# Patient Record
Sex: Female | Born: 1990 | Hispanic: Yes | Marital: Married | Smoking: Former smoker
Health system: Southern US, Community
[De-identification: ages and names within clinical notes are randomized; demographics above are authoritative.]

## PROBLEM LIST (undated history)

## (undated) DIAGNOSIS — R519 Headache, unspecified: Secondary | ICD-10-CM

## (undated) DIAGNOSIS — J45909 Unspecified asthma, uncomplicated: Secondary | ICD-10-CM

## (undated) DIAGNOSIS — F32A Depression, unspecified: Secondary | ICD-10-CM

## (undated) DIAGNOSIS — F419 Anxiety disorder, unspecified: Secondary | ICD-10-CM

## (undated) DIAGNOSIS — F329 Major depressive disorder, single episode, unspecified: Secondary | ICD-10-CM

## (undated) DIAGNOSIS — O2686 Pruritic urticarial papules and plaques of pregnancy (PUPPP): Secondary | ICD-10-CM

## (undated) HISTORY — PX: HERNIA REPAIR: SHX51

---
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
## 2008-07-22 DIAGNOSIS — J45990 Exercise induced bronchospasm: Secondary | ICD-10-CM | POA: Insufficient documentation

## 2010-02-08 DIAGNOSIS — R41840 Attention and concentration deficit: Secondary | ICD-10-CM | POA: Insufficient documentation

## 2010-02-08 DIAGNOSIS — G43909 Migraine, unspecified, not intractable, without status migrainosus: Secondary | ICD-10-CM | POA: Insufficient documentation

## 2012-05-29 DIAGNOSIS — O2686 Pruritic urticarial papules and plaques of pregnancy (PUPPP): Secondary | ICD-10-CM

## 2013-12-26 DIAGNOSIS — F332 Major depressive disorder, recurrent severe without psychotic features: Secondary | ICD-10-CM | POA: Insufficient documentation

## 2013-12-26 DIAGNOSIS — M5430 Sciatica, unspecified side: Secondary | ICD-10-CM | POA: Insufficient documentation

## 2017-07-18 DIAGNOSIS — F4310 Post-traumatic stress disorder, unspecified: Secondary | ICD-10-CM | POA: Insufficient documentation

## 2019-01-15 HISTORY — PX: GALLBLADDER SURGERY: SHX652

## 2019-04-28 ENCOUNTER — Encounter (HOSPITAL_COMMUNITY): Payer: Self-pay | Admitting: *Deleted

## 2019-04-28 ENCOUNTER — Other Ambulatory Visit: Payer: Self-pay

## 2019-04-28 ENCOUNTER — Inpatient Hospital Stay (HOSPITAL_COMMUNITY): Payer: BC Managed Care – PPO

## 2019-04-28 ENCOUNTER — Inpatient Hospital Stay (HOSPITAL_COMMUNITY)
Admission: AD | Admit: 2019-04-28 | Discharge: 2019-04-28 | Disposition: A | Discharge status: Home / Self Care | Payer: BC Managed Care – PPO | Attending: Obstetrics and Gynecology | Admitting: Obstetrics and Gynecology

## 2019-04-28 DIAGNOSIS — O4691 Antepartum hemorrhage, unspecified, first trimester: Secondary | ICD-10-CM

## 2019-04-28 DIAGNOSIS — O200 Threatened abortion: Secondary | ICD-10-CM | POA: Diagnosis not present

## 2019-04-28 DIAGNOSIS — Z3491 Encounter for supervision of normal pregnancy, unspecified, first trimester: Secondary | ICD-10-CM | POA: Diagnosis not present

## 2019-04-28 DIAGNOSIS — O26891 Other specified pregnancy related conditions, first trimester: Secondary | ICD-10-CM | POA: Insufficient documentation

## 2019-04-28 DIAGNOSIS — Z87891 Personal history of nicotine dependence: Secondary | ICD-10-CM | POA: Insufficient documentation

## 2019-04-28 DIAGNOSIS — Z79899 Other long term (current) drug therapy: Secondary | ICD-10-CM | POA: Diagnosis not present

## 2019-04-28 DIAGNOSIS — O4690 Antepartum hemorrhage, unspecified, unspecified trimester: Secondary | ICD-10-CM

## 2019-04-28 DIAGNOSIS — Z3A01 Less than 8 weeks gestation of pregnancy: Secondary | ICD-10-CM | POA: Insufficient documentation

## 2019-04-28 DIAGNOSIS — R109 Unspecified abdominal pain: Secondary | ICD-10-CM | POA: Diagnosis not present

## 2019-04-28 HISTORY — DX: Anxiety disorder, unspecified: F41.9

## 2019-04-28 HISTORY — DX: Depression, unspecified: F32.A

## 2019-04-28 HISTORY — DX: Unspecified asthma, uncomplicated: J45.909

## 2019-04-28 HISTORY — DX: Headache, unspecified: R51.9

## 2019-04-28 LAB — CBC
HCT: 40.7 % (ref 36.0–46.0)
Hemoglobin: 13.4 g/dL (ref 12.0–15.0)
MCH: 28.6 pg (ref 26.0–34.0)
MCHC: 32.9 g/dL (ref 30.0–36.0)
MCV: 86.8 fL (ref 80.0–100.0)
Platelets: 285 10*3/uL (ref 150–400)
RBC: 4.69 MIL/uL (ref 3.87–5.11)
RDW: 13.5 % (ref 11.5–15.5)
WBC: 8.6 10*3/uL (ref 4.0–10.5)
nRBC: 0 % (ref 0.0–0.2)

## 2019-04-28 LAB — WET PREP, GENITAL
Clue Cells Wet Prep HPF POC: NONE SEEN
Sperm: NONE SEEN
Trich, Wet Prep: NONE SEEN
Yeast Wet Prep HPF POC: NONE SEEN

## 2019-04-28 LAB — ABO/RH: ABO/RH(D): O POS

## 2019-04-28 LAB — URINALYSIS, ROUTINE W REFLEX MICROSCOPIC
Bilirubin Urine: NEGATIVE
Glucose, UA: NEGATIVE mg/dL
Ketones, ur: NEGATIVE mg/dL
Leukocytes,Ua: NEGATIVE
Nitrite: NEGATIVE
Protein, ur: NEGATIVE mg/dL
RBC / HPF: >50 RBC/hpf — ABNORMAL HIGH (ref 0–5)
Specific Gravity, Urine: 1.023 (ref 1.005–1.030)
pH: 5 (ref 5.0–8.0)

## 2019-04-28 LAB — HCG, QUANTITATIVE, PREGNANCY: hCG, Beta Chain, Quant, S: 6112 m[IU]/mL — ABNORMAL HIGH (ref <5)

## 2019-04-28 LAB — POCT PREGNANCY, URINE: Preg Test, Ur: POSITIVE — AB

## 2019-04-28 NOTE — MAU Note (Signed)
+  HPT. LMP 03/21/19.   Vaginal bleeding since Friday, red blood on pad, but not soaking pads. Cramping started Saturday, improved now 3/10 on scale.

## 2019-04-28 NOTE — MAU Provider Note (Signed)
History     CSN: [id number]  Arrival date and time: 04/28/19 1148   First Provider Initiated Contact with Patient 04/28/19 1237      Chief Complaint  Patient presents with  . Abdominal Pain  . Vaginal Bleeding   Vicki Ford is a 28 y.o. G2P2 at [redacted]w[redacted]d by LMP who presents to MAU with complaints of vaginal bleeding and abdominal pain. Patient reports she has spotting for one day when she first found out she was pregnant, then had a couple of days of spotting 2 weeks ago that resolved. Reports on Friday she started back spotting and since then it has gotten heavier like of period. Describes bleeding as dark red with tiny clots- patient reports clots smaller than dime size. She denies hx of miscarriage in the past. She reports being seen at [facility] on Saturday and was told that nothing was seen on [place]. She reports that she feels like they did not do anything and did not tell her what is actually going on. She reports abdominal pain started occurring on Saturday, describes abdominal pain as lower abdominal cramping, rates pain 3/10- has not taken any medication for abdominal pain.    OB History    Gravida  2   Para  1   Term  0   Preterm  1   AB  0   Living  2     SAB  0   TAB  0   Ectopic  0   Multiple  1   Live Births  2           Past Medical History:  Diagnosis Date  . Anxiety   . Asthma    sports induced asthma  . Depression   . Headache   . Preterm labor     Past Surgical History:  Procedure Laterality Date  . CESAREAN SECTION    . GALLBLADDER SURGERY  01/15/2019  . HERNIA REPAIR      Family History  Problem Relation Age of Onset  . Cancer Mother   . Depression Mother   . ADD / ADHD Sister   . Depression Sister     Social History   Tobacco Use  . Smoking status: Former Games developer  . Smokeless tobacco: Never Used  Substance Use Topics  . Alcohol use: Not Currently  . Drug use: Never    Allergies: Not on File  Medications Prior to Admission   Medication Sig Dispense Refill Last Dose  . hydrOXYzine (ATARAX/VISTARIL) 25 MG tablet Take 25 mg by mouth 3 (three) times daily as needed.   Past Month at Unknown time  . sertraline (ZOLOFT) 50 MG tablet Take 50 mg by mouth daily.   Past Month at Unknown time    Review of Systems  Constitutional: Negative.   Respiratory: Negative.   Cardiovascular: Negative.   Gastrointestinal: Positive for abdominal pain. Negative for constipation, diarrhea, nausea and vomiting.  Genitourinary: Positive for vaginal bleeding. Negative for difficulty urinating, dysuria, frequency, pelvic pain and urgency.  Musculoskeletal: Negative.   Neurological: Negative.    Physical Exam   Blood pressure 124/70, pulse 100, temperature 98.7 F (37.1 C), resp. rate 18, weight 89.8 kg, last menstrual period 03/21/2019, SpO2 100 %.  Physical Exam  Nursing note and vitals reviewed. Constitutional: She is oriented to person, place, and time. She appears well-developed and well-nourished.  Cardiovascular: Normal rate and regular rhythm.  Respiratory: Effort normal and breath sounds normal. No respiratory distress. She has no wheezes.  GI:  Soft. She exhibits no distension. There is no abdominal tenderness. There is no rebound and no guarding.  Genitourinary:    Vaginal bleeding present.  There is bleeding in the vagina.    Genitourinary Comments: Pelvic exam: Cervix pink, visually open to 1cm- blood clot in cervical os, without lesion, small amount of dark red vaginal bleeding, vaginal walls and external genitalia normal Bimanual exam: Cervix closed internally/long/high, firm, anterior, neg CMT, uterus nontender, nonenlarged, adnexa without tenderness, enlargement, or mass   Neurological: She is alert and oriented to person, place, and time.  Psychiatric: She has a normal mood and affect. Her behavior is normal. Thought content normal.   Dilation: Closed(external os slightly open with clot in cervical os) Cervical  Position: Posterior Exam by:: Steward DroneVeronica Ariann Khaimov, CNM  MAU Course  Procedures  MDM  Chart reviewed- no information or records in care everywhere  Labs and US ordered d/t no records   Orders placed:  Orders Placed This Encounter  Procedures  . Wet prep, genital  . US OB LESS THAN 14 WEEKS WITH OB TRANSVAGINAL  . Urinalysis, Routine w reflex microscopic  . CBC  . hCG, quantitative, pregnancy  . Pregnancy, urine POC  . ABO/Rh   Labs and US reviewed:  Results for orders placed or performed during the hospital encounter of 04/28/19 (from the past 24 hour(s))  Pregnancy, urine POC     Status: Abnormal   Collection Time: 04/28/19 12:09 PM  Result Value Ref Range   Preg Test, Ur POSITIVE (A) NEGATIVE  Urinalysis, Routine w reflex microscopic     Status: Abnormal   Collection Time: 04/28/19 12:15 PM  Result Value Ref Range   Color, Urine YELLOW YELLOW   APPearance CLEAR CLEAR   Specific Gravity, Urine 1.023 1.005 - 1.030   pH 5.0 5.0 - 8.0   Glucose, UA NEGATIVE NEGATIVE mg/dL   Hgb urine dipstick LARGE (A) NEGATIVE   Bilirubin Urine NEGATIVE NEGATIVE   Ketones, ur NEGATIVE NEGATIVE mg/dL   Protein, ur NEGATIVE NEGATIVE mg/dL   Nitrite NEGATIVE NEGATIVE   Leukocytes,Ua NEGATIVE NEGATIVE   RBC / HPF >50 (H) 0 - 5 RBC/hpf   WBC, UA 0-5 0 - 5 WBC/hpf   Bacteria, UA RARE (A) NONE SEEN   Squamous Epithelial / LPF 0-5 0 - 5   Mucus PRESENT   Wet prep, genital     Status: Abnormal   Collection Time: 04/28/19 12:28 PM   Specimen: PATH Cytology Cervicovaginal Ancillary Only  Result Value Ref Range   Yeast Wet Prep HPF POC NONE SEEN NONE SEEN   Trich, Wet Prep NONE SEEN NONE SEEN   Clue Cells Wet Prep HPF POC NONE SEEN NONE SEEN   WBC, Wet Prep HPF POC MODERATE (A) NONE SEEN   Sperm NONE SEEN   CBC     Status: None   Collection Time: 04/28/19 12:37 PM  Result Value Ref Range   WBC 8.6 4.0 - 10.5 K/uL   RBC 4.69 3.87 - 5.11 MIL/uL   Hemoglobin 13.4 12.0 - 15.0 g/dL   HCT 16.140.7  09.636.0 - 04.546.0 %   MCV 86.8 80.0 - 100.0 fL   MCH 28.6 26.0 - 34.0 pg   MCHC 32.9 30.0 - 36.0 g/dL   RDW 40.913.5 81.111.5 - 91.415.5 %   Platelets 285 150 - 400 K/uL   nRBC 0.0 0.0 - 0.2 %  ABO/Rh     Status: None   Collection Time: 04/28/19 12:37 PM  Result Value Ref  Range   ABO/RH(D) O POS    No rh immune globuloin      NOT A RH IMMUNE GLOBULIN CANDIDATE, PT RH POSITIVE Performed at [facility] [address], [place], [place] [address]   hCG, quantitative, pregnancy     Status: Abnormal   Collection Time: 04/28/19 12:37 PM  Result Value Ref Range   hCG, Beta Chain, Quant, S 6,112 (H) <5 mIU/mL   US Ob Less Than 14 Weeks With Ob Transvaginal  Result Date: 04/28/2019 CLINICAL DATA:  Pelvic pain with vaginal bleeding EXAM: OBSTETRIC <14 WK [place] AND TRANSVAGINAL OB US TECHNIQUE: Both transabdominal and transvaginal ultrasound examinations were performed for complete evaluation of the gestation as well as the maternal uterus, adnexal regions, and pelvic cul-de-sac. Transvaginal technique was performed to assess early pregnancy. COMPARISON:  None. FINDINGS: Intrauterine gestational sac: Visualized Yolk sac:  Visualized Embryo:  Not visualized Cardiac Activity: Not visualized MSD: 6 mm mm   5 w   2 d Subchorionic hemorrhage:  None visualized. Maternal uterus/adnexae: Cervical os is closed. Right ovary measures 2.4 x 1.6 x 1.7 cm. Left ovary measures 3.2 x 2.3 x 2.5 cm. No extrauterine pelvic or adnexal mass. No free pelvic fluid. IMPRESSION: There is a small gestational sac within the uterus. There is an apparent yolk sac but no fetal pole seen currently. This finding warrants a follow-up ultrasound in 10-12 weeks to assess for fetal pole and fetal heart activity. No subchorionic hemorrhage evident. Study otherwise unremarkable. Electronically Signed   By: Lowella Grip III M.D.   On: 04/28/2019 13:49   Discussed results of [place] and labs with patient- threatened miscarriage precautions reviewed with  patient. Follow up HCG scheduled in the office for Wednesday and repeat US ordered to assess viability. Patient understands plan of care.   Discussed reasons to return to MAU. Follow up as scheduled in the office. Return to MAU as needed. Pt stable at time of discharge.   Assessment and Plan   1. Normal IUP (intrauterine pregnancy) on prenatal ultrasound, first trimester   2. Abdominal pain during pregnancy in first trimester   3. Vaginal bleeding during pregnancy   4. Threatened miscarriage in early pregnancy    Discharge home Follow up as scheduled in the office for repeat HCG on Wednesday  Return to MAU as needed for reasons discussed and/or emergencies  Viability [place] ordered   [facility]. Go on 04/30/2019.   Specialty: Obstetrics and Gynecology Why: Go to office on Wednesday morning for repeat labs  Contact information: [address] [place], [place] [id number] [place] [id number] [phone]         Allergies as of 04/28/2019   Not on File     Medication List    TAKE these medications   hydrOXYzine 25 MG tablet Commonly known as: ATARAX/VISTARIL Take 25 mg by mouth 3 (three) times daily as needed.   sertraline 50 MG tablet Commonly known as: ZOLOFT Take 50 mg by mouth daily.       Lajean Manes CNM 04/28/2019, 3:05 PM

## 2019-04-29 LAB — CULTURE, OB URINE: Culture: NO GROWTH

## 2019-04-29 LAB — GC/CHLAMYDIA PROBE AMP (~~LOC~~) NOT AT ARMC
Chlamydia: NEGATIVE
Comment: NEGATIVE
Comment: NORMAL
Neisseria Gonorrhea: NEGATIVE

## 2019-04-30 ENCOUNTER — Encounter (HOSPITAL_COMMUNITY): Payer: Self-pay

## 2019-04-30 ENCOUNTER — Other Ambulatory Visit: Payer: Self-pay

## 2019-04-30 ENCOUNTER — Inpatient Hospital Stay (HOSPITAL_COMMUNITY)
Admission: AD | Admit: 2019-04-30 | Discharge: 2019-04-30 | Disposition: A | Discharge status: Home / Self Care | Payer: BC Managed Care – PPO | Attending: Obstetrics & Gynecology | Admitting: Obstetrics & Gynecology

## 2019-04-30 ENCOUNTER — Ambulatory Visit (INDEPENDENT_AMBULATORY_CARE_PROVIDER_SITE_OTHER): Payer: BC Managed Care – PPO

## 2019-04-30 ENCOUNTER — Inpatient Hospital Stay (HOSPITAL_COMMUNITY): Payer: BC Managed Care – PPO

## 2019-04-30 DIAGNOSIS — Z3A01 Less than 8 weeks gestation of pregnancy: Secondary | ICD-10-CM

## 2019-04-30 DIAGNOSIS — O209 Hemorrhage in early pregnancy, unspecified: Secondary | ICD-10-CM | POA: Diagnosis present

## 2019-04-30 DIAGNOSIS — Z3A00 Weeks of gestation of pregnancy not specified: Secondary | ICD-10-CM | POA: Insufficient documentation

## 2019-04-30 DIAGNOSIS — O200 Threatened abortion: Secondary | ICD-10-CM

## 2019-04-30 LAB — CBC
HCT: 39.7 % (ref 36.0–46.0)
Hemoglobin: 13.1 g/dL (ref 12.0–15.0)
MCH: 28.6 pg (ref 26.0–34.0)
MCHC: 33 g/dL (ref 30.0–36.0)
MCV: 86.7 fL (ref 80.0–100.0)
Platelets: 287 10*3/uL (ref 150–400)
RBC: 4.58 MIL/uL (ref 3.87–5.11)
RDW: 13.5 % (ref 11.5–15.5)
WBC: 8.9 10*3/uL (ref 4.0–10.5)
nRBC: 0 % (ref 0.0–0.2)

## 2019-04-30 LAB — BETA HCG QUANT (REF LAB): hCG Quant: 5693 m[IU]/mL

## 2019-04-30 NOTE — Progress Notes (Signed)
Pt states bleeding had subsided to a brown discharge, however, today after clot passed she has had bright red bleeding.  Has not seen any clots since then.

## 2019-04-30 NOTE — MAU Provider Note (Signed)
History     CSN: [id number]  Arrival date and time: 04/30/19 1422   None     Chief Complaint  Patient presents with  . Vaginal Bleeding   HPI   Vicki Ford is a 28 y.o., [id number] @ [redacted]w[redacted]d by LMP here for vaginal bleeding and lower abdominal cramping since 11/27. Patient was initially seen at [facility] on 11/28 and again here at MAU on 11/30 for the same symptoms and they have not stopped since. Vaginal bleeding has been low in volume (patient is using a panty liner/day) and varies from bright red blood to dark brown. Clots have been seen since 11/30 and have been increasing in size since. Today she passed one clot that was about size of a golf ball. The abdominal cramping is intermittent and not painful.  Blood type O positive.  Patient denies : lightheadedness, fatigue, chest pain, urinary symptoms, fever, back pain, nausea, vomiting   Patient's hCG quantitative was 3,213 on 11/28 and 6,112 on 11/30. Patient had another drawn as outpatient today, result pending.    OB History    Gravida  2   Para  1   Term  0   Preterm  1   AB  0   Living  2     SAB  0   TAB  0   Ectopic  0   Multiple  1   Live Births  2           Past Medical History:  Diagnosis Date  . Anxiety   . Asthma    sports induced asthma  . Depression   . Headache   . Preterm labor     Past Surgical History:  Procedure Laterality Date  . CESAREAN SECTION    . GALLBLADDER SURGERY  01/15/2019  . HERNIA REPAIR      Family History  Problem Relation Age of Onset  . Cancer Mother   . Depression Mother   . ADD / ADHD Sister   . Depression Sister     Social History   Tobacco Use  . Smoking status: Former Research scientist (life sciences)  . Smokeless tobacco: Never Used  Substance Use Topics  . Alcohol use: Not Currently  . Drug use: Never    Allergies: Not on File  Medications Prior to Admission  Medication Sig Dispense Refill Last Dose  . hydrOXYzine (ATARAX/VISTARIL) 25 MG tablet Take 25 mg by mouth 3  (three) times daily as needed.     . sertraline (ZOLOFT) 50 MG tablet Take 50 mg by mouth daily.       Review of Systems  Constitutional: Negative.   Cardiovascular: Negative.   Gastrointestinal: Positive for abdominal pain (intermittent mild lower abdominal cramping, non painful per patient). Negative for diarrhea, nausea and vomiting.  Genitourinary: Positive for vaginal bleeding. Negative for dysuria, flank pain, frequency, pelvic pain, vaginal discharge and vaginal pain.  Musculoskeletal: Negative for back pain.   Physical Exam   Blood pressure 115/82, pulse 92, temperature 98.5 F (36.9 C), temperature source Oral, resp. rate 18, weight 89.6 kg, last menstrual period 03/21/2019, SpO2 100 %.  Physical Exam  Nursing note and vitals reviewed. Constitutional: She is oriented to person, place, and time. She appears well-developed and well-nourished. No distress.  Cardiovascular: Normal rate.  Respiratory: Effort normal.  GI: Soft.  Genitourinary:    Vagina normal.  There is no rash, tenderness, lesion or injury on the right labia. There is no rash, tenderness, lesion or injury on the left labia.  Cervix exhibits discharge. Cervix exhibits no friability.    Genitourinary Comments: One fox swab use to clean small clots x 2 at cervical os. Normal looking cervical os with minimal bleeding. Minimal blood visualized at vaginal vault.    Neurological: She is alert and oriented to person, place, and time.  Skin: Skin is warm and dry. No erythema.  Psychiatric: She has a normal mood and affect.    MAU Course  Procedures  - Patient's hCG values have been : 3,213 (11/28), 6,112 (11/30). CBC, wet prep, CB/CT, and UA all WNL on 11/30. Will not get hCG today. Will get CBC and transvaginal US today.  Results for orders placed or performed during the hospital encounter of 04/30/19 (from the past 24 hour(s))  CBC     Status: None   Collection Time: 04/30/19  4:26 PM  Result Value Ref Range   WBC  8.9 4.0 - 10.5 K/uL   RBC 4.58 3.87 - 5.11 MIL/uL   Hemoglobin 13.1 12.0 - 15.0 g/dL   HCT 16.139.7 09.636.0 - 04.546.0 %   MCV 86.7 80.0 - 100.0 fL   MCH 28.6 26.0 - 34.0 pg   MCHC 33.0 30.0 - 36.0 g/dL   RDW 40.913.5 81.111.5 - 91.415.5 %   Platelets 287 150 - 400 K/uL   nRBC 0.0 0.0 - 0.2 %   [place] Ob Transvaginal  Result Date: 04/30/2019 CLINICAL DATA:  Bleeding EXAM: TRANSVAGINAL OB ULTRASOUND TECHNIQUE: Transvaginal ultrasound was performed for complete evaluation of the gestation as well as the maternal uterus, adnexal regions, and pelvic cul-de-sac. COMPARISON:  04/28/2019 FINDINGS: Intrauterine gestational sac: Sac-like structure in the lower uterine segment without significant change in size. Yolk sac:  Not seen Embryo:  Not seen MSD: 4.4 mm   5 w   1 d Subchorionic hemorrhage:  None visualized. Maternal uterus/adnexae: Ovaries are within normal limits. Right ovary measures 2.5 x 1.6 x 1.7 cm. Left ovary measures 3 x 2 x 2.6 cm. IMPRESSION: 1. Small sac-like structure in the lower uterine segment without significant change in position. Previously suggested yolk sac is no longer identified. No embryo identified. Electronically Signed   By: Jasmine PangKim  Fujinaga M.D.   On: 04/30/2019 17:52    MDM - Patient is mainly concerned about the clots and states that her bleeding has been minimal. Discussed with the patient regarding reassuring recent hCG values but will need US to r/o other possible pregnancy complications. Last US from 11/30 showed a small gestation sac and an apparent yolk sac in the uterus w/o fetal pole visualized and no evident subchorionic hemorrhage. - hCG from outpatient today is 5,693 (6,122 two days ago). No significant changes in US findings compared to two days ago.  - Discussed with patient that a threatened miscarriage is suspected and cannot be r/o in or out at this time. Will order a f/u US 8 days from today which would be 10 days from the last US from two days ago.       Assessment and Plan    Problem List Items Addressed This Visit    None    Visit Diagnoses    Threatened miscarriage in early pregnancy    -  Primary   Relevant Orders   Discharge patient   US OB Transvaginal   Vaginal bleeding in pregnancy, first trimester       Relevant Orders   US OB Transvaginal (Completed)   Discharge patient   US OB Transvaginal     1. Threatened  miscarriage in early pregnancy - F/U US ordered for 05/08/19.  2. Vaginal bleeding in pregnancy, first trimester - Discussed with the patient that the bleeding may continue for a while. Educated the patient to come back to MAU if her symptoms worsen.   Eduard Clos 04/30/2019, 7:14 PM

## 2019-04-30 NOTE — MAU Note (Signed)
Was seen for bleeding the other day, told to return if anything changes. Passed a large clot about 35 min ago(larger then a quarter, smaller then a golfball). No pain, bleeding has been like a period.

## 2019-05-01 NOTE — Progress Notes (Signed)
Pt here today for STAT Beta Lab.  Pt states that she passed a large clot within the past 30 minutes of arriving for her beta today.  Pt states that she thinks that she is having a miscarriage.  I explained to the pt that we get her pregnancy hormone level to compare results.  Pt informed that I would call within the next 2-3 hours with results.     Received results of beta levels were 5693.  Per chart review pt went to MAU after leaving from the office.    Called pt on 05/01/19 because pt went to MAU.  Pt reports that she went to MAU yesterday because after she left the office she started bleeding like a period.  Pt stated that she was advised that MAU would be scheduling her an [place] for evaluation.  I notified pt her beta results and continue with the plan of care from MAU.  Pt verbalized understanding.   Mel Almond, RN 05/01/19

## 2019-05-12 ENCOUNTER — Encounter: Payer: Self-pay | Admitting: *Deleted

## 2019-05-12 ENCOUNTER — Other Ambulatory Visit: Payer: Self-pay

## 2019-05-12 ENCOUNTER — Ambulatory Visit (HOSPITAL_COMMUNITY)
Admission: RE | Admit: 2019-05-12 | Discharge: 2019-05-12 | Disposition: A | Discharge status: Home / Self Care | Payer: BC Managed Care – PPO | Source: Ambulatory Visit | Attending: Advanced Practice Midwife | Admitting: Advanced Practice Midwife

## 2019-05-12 ENCOUNTER — Ambulatory Visit (INDEPENDENT_AMBULATORY_CARE_PROVIDER_SITE_OTHER): Payer: BC Managed Care – PPO | Admitting: Family Medicine

## 2019-05-12 VITALS — Wt 196.0 lb

## 2019-05-12 DIAGNOSIS — O209 Hemorrhage in early pregnancy, unspecified: Secondary | ICD-10-CM | POA: Diagnosis present

## 2019-05-12 DIAGNOSIS — Z3490 Encounter for supervision of normal pregnancy, unspecified, unspecified trimester: Secondary | ICD-10-CM

## 2019-05-12 DIAGNOSIS — O200 Threatened abortion: Secondary | ICD-10-CM | POA: Insufficient documentation

## 2019-05-12 NOTE — Progress Notes (Signed)
Pt here for u/s results. Has some brown d/c and occ crampiness but none currently. Spoke with Dr Marice Potter who reviewed u/s results. Will repeat BHCG today (nonstat) . Pt is very frustrated that a sac was seen several wks ago and now no sac seen. [place] to see twins at 4 wks and she would like to speak to MD. Dr Marice Potter in to talk with pt

## 2019-05-12 NOTE — Progress Notes (Signed)
GYNECOLOGY OFFICE VISIT NOTE  History:   Vicki Ford is a 28 y.o. [phone] here today for [place] f/u and HCG. Patient reports LMP 10/23 with positive pregnancy test the next week. Reports some spotting/bleeding at the end of November; does report passing some clots. Described as lighter than a menstrual period but heavier than spotting. She had an [place] 12/2 which showed small sac-like structure in lower uterine segment, no yolk sac present. HCG 5693 at that time; previously 4540 on 11/30. Patient reports breast tenderness and overall feeling like she is pregnant. She is very frustrated that she still does not have an answer on exactly what is going on. Light spotting currently.    Past Medical History:  Diagnosis Date  . Anxiety    takes zoloft, buspar, & atarax  . Asthma    sports induced asthma  . Depression    takes zoloft & buspar  . Headache   . Preterm labor     Past Surgical History:  Procedure Laterality Date  . CESAREAN SECTION    . GALLBLADDER SURGERY  01/15/2019  . HERNIA REPAIR      The following portions of the patient's history were reviewed and updated as appropriate: allergies, current medications, past family history, past medical history, past social history, past surgical history and problem list.    Review of Systems:  Pertinent items noted in HPI and remainder of comprehensive ROS otherwise negative.  Physical Exam:  Wt 196 lb (88.9 kg)   LMP 03/21/2019 (Exact Date)  CONSTITUTIONAL: Well-developed, well-nourished female in no acute distress.  HEENT:  Normocephalic, atraumatic. External right and left ear normal. No scleral icterus.  NECK: Normal range of motion, supple, no masses noted on observation SKIN: No rash noted. Not diaphoretic. No erythema. No pallor. MUSCULOSKELETAL: Normal range of motion. No edema noted. NEUROLOGIC: Alert and oriented to person, place, and time. Normal muscle tone coordination. No cranial nerve deficit noted. PSYCHIATRIC: Normal  mood and affect. Normal behavior. Normal judgment and thought content. CARDIOVASCULAR: Normal heart rate noted RESPIRATORY: Effort normal  ABDOMEN: No masses noted. No other overt distention noted.   PELVIC: Deferred  Labs and Imaging No results found for this or any previous visit (from the past 168 hour(s)). US OB Transvaginal  Addendum Date: 05/12/2019   ADDENDUM REPORT: 05/12/2019 14:54 COMPARISON:  04/30/2019 and 04/28/2019. FINDINGS: The previously seen sac-like structure in the lower uterine segment is no longer visualized on today's study. In its place is an echogenic area measuring 16 x 10 mm. Given that this echogenic area was not seen on prior study, a polyp is felt unlikely. This could reflect blood products. IMPRESSION: Previously seen sac-like structure in the lower uterine segment no longer visualized. Probable blood clot within the endocervical canal. Echogenic area in the lower uterine segment may also reflect blood products. Electronically Signed   By: Rolm Baptise M.D.   On: 05/12/2019 14:54   Result Date: 05/12/2019 CLINICAL DATA:  Threatened abortion EXAM: TRANSVAGINAL OB ULTRASOUND TECHNIQUE: Transvaginal ultrasound was performed for complete evaluation of the gestation as well as the maternal uterus, adnexal regions, and pelvic cul-de-sac. COMPARISON:  None. FINDINGS: Intrauterine gestational sac: None Yolk sac:  Not visualized Embryo:  Not visualized Cardiac Activity: Not visualized Heart Rate:  bpm MSD:   mm    w     d CRL:     mm    w  d  US EDC: Subchorionic hemorrhage:  N/A Maternal uterus/adnexae: There is clot seen within the endocervical canal. Echogenic area is noted with in the endometrium in the lower uterine segment measuring 16 x 10 mm. No adnexal mass or free fluid. IMPRESSION: No intrauterine pregnancy visualized. Differential considerations would include early intrauterine pregnancy too early to visualize, spontaneous abortion, or occult ectopic  pregnancy. Recommend close clinical followup and serial quantitative beta HCGs and ultrasounds. 16 x 10 mm echogenic area within the endometrium in the lower uterine segment. This could reflect endometrial polyp. Blood products suspected in the endocervical canal. Electronically Signed: By: Charlett NoseKevin  Dover M.D. On: 05/12/2019 14:35   US OB Transvaginal  Result Date: 04/30/2019 CLINICAL DATA:  Bleeding EXAM: TRANSVAGINAL OB ULTRASOUND TECHNIQUE: Transvaginal ultrasound was performed for complete evaluation of the gestation as well as the maternal uterus, adnexal regions, and pelvic cul-de-sac. COMPARISON:  04/28/2019 FINDINGS: Intrauterine gestational sac: Sac-like structure in the lower uterine segment without significant change in size. Yolk sac:  Not seen Embryo:  Not seen MSD: 4.4 mm   5 w   1 d Subchorionic hemorrhage:  None visualized. Maternal uterus/adnexae: Ovaries are within normal limits. Right ovary measures 2.5 x 1.6 x 1.7 cm. Left ovary measures 3 x 2 x 2.6 cm. IMPRESSION: 1. Small sac-like structure in the lower uterine segment without significant change in position. Previously suggested yolk sac is no longer identified. No embryo identified. Electronically Signed   By: Jasmine PangKim  Fujinaga M.D.   On: 04/30/2019 17:52   US OB LESS THAN 14 WEEKS WITH OB TRANSVAGINAL  Result Date: 04/28/2019 CLINICAL DATA:  Pelvic pain with vaginal bleeding EXAM: OBSTETRIC <14 WK US AND TRANSVAGINAL OB US TECHNIQUE: Both transabdominal and transvaginal ultrasound examinations were performed for complete evaluation of the gestation as well as the maternal uterus, adnexal regions, and pelvic cul-de-sac. Transvaginal technique was performed to assess early pregnancy. COMPARISON:  None. FINDINGS: Intrauterine gestational sac: Visualized Yolk sac:  Visualized Embryo:  Not visualized Cardiac Activity: Not visualized MSD: 6 mm mm   5 w   2 d Subchorionic hemorrhage:  None visualized. Maternal uterus/adnexae: Cervical os is  closed. Right ovary measures 2.4 x 1.6 x 1.7 cm. Left ovary measures 3.2 x 2.3 x 2.5 cm. No extrauterine pelvic or adnexal mass. No free pelvic fluid. IMPRESSION: There is a small gestational sac within the uterus. There is an apparent yolk sac but no fetal pole seen currently. This finding warrants a follow-up ultrasound in 10-12 weeks to assess for fetal pole and fetal heart activity. No subchorionic hemorrhage evident. Study otherwise unremarkable. Electronically Signed   By: Bretta BangWilliam  Woodruff III M.D.   On: 04/28/2019 13:49      Assessment and Plan:    1. Pregnancy at early stage - Beta hCG quant (ref lab) - US today without previously-seen sac-like structure. No adnexal mass or free fluid. - Suspect miscarriage and b hCG drawn to clarify; expect to be dramatically decreasing from prior; if not, will have to repeat US in 7-14 days - Recommended following hCG to < 5 if decreasing although patient reported she wanted to be done with having more labs regardless; will follow hCG  Routine preventative health maintenance measures emphasized. Please refer to After Visit Summary for other counseling recommendations.   Return if symptoms worsen or fail to improve.    Total face-to-face time with patient: 15 minutes.  Over 50% of encounter was spent on counseling and coordination of care.  Jerilynn Birkenheadhelsea Shevette Bess, MD [facility]  Medicine Fellow, [facility] for [employer], [facility]

## 2019-05-13 ENCOUNTER — Telehealth: Payer: Self-pay | Admitting: *Deleted

## 2019-05-13 LAB — BETA HCG QUANT (REF LAB): hCG Quant: 8090 m[IU]/mL

## 2019-05-13 NOTE — Telephone Encounter (Signed)
I called Vicki Ford and confirmed Dr.Fair had spoken with her and she understands she is to have a stat bhcg tomorrow at 2pm . I explained I will send message to registrar to schedule and if there are any issues with appointment she will be contacted. I also explained she will wait for results and then plan of care based on results. She voices understanding. Linda,RN

## 2019-05-13 NOTE — Telephone Encounter (Signed)
Dr. Barrington Ellison left a voicemessage she saw Ms. Postma yesterday and and patient needs stat hcg tomorrow and has agreed to come in around 2pm . Wanted to let us know to schedule it. States if there are any questions may call her but she is working nights.  Myrick Mcnairy,RN

## 2019-05-14 ENCOUNTER — Other Ambulatory Visit: Payer: Self-pay

## 2019-05-14 ENCOUNTER — Ambulatory Visit (INDEPENDENT_AMBULATORY_CARE_PROVIDER_SITE_OTHER): Payer: BC Managed Care – PPO | Admitting: General Practice

## 2019-05-14 DIAGNOSIS — O3680X0 Pregnancy with inconclusive fetal viability, not applicable or unspecified: Secondary | ICD-10-CM

## 2019-05-14 DIAGNOSIS — O039 Complete or unspecified spontaneous abortion without complication: Secondary | ICD-10-CM

## 2019-05-14 LAB — BETA HCG QUANT (REF LAB): hCG Quant: 8236 m[IU]/mL

## 2019-05-14 MED ORDER — MISOPROSTOL 200 MCG PO TABS: 800.0000 ug | ORAL_TABLET | Freq: Once | ORAL | 0 refills | Status: DC

## 2019-05-14 MED ORDER — PROMETHAZINE HCL 12.5 MG PO TABS: 12.5000 mg | ORAL_TABLET | Freq: Four times a day (QID) | ORAL | 0 refills | Status: DC | PRN

## 2019-05-14 MED ORDER — OXYCODONE-ACETAMINOPHEN 5-325 MG PO TABS: 1.0000 | ORAL_TABLET | Freq: Four times a day (QID) | ORAL | 0 refills | Status: DC | PRN

## 2019-05-14 MED ORDER — IBUPROFEN 800 MG PO TABS: 800.0000 mg | ORAL_TABLET | Freq: Three times a day (TID) | ORAL | 0 refills | Status: DC | PRN

## 2019-05-14 NOTE — Addendum Note (Signed)
Addended by: Luvenia Redden on: 05/14/2019 05:05 PM   Modules accepted: Orders

## 2019-05-14 NOTE — Progress Notes (Signed)
Patient presents to office today for stat bhcg following up from 12/14 visit. Patient reports only small amount of brown discharge currently- previously had bleeding like a period 2 weeks ago. Denies pain. Discussed with patient we are checking her bhcg levels today, results take approximately 2 hours to come back, and we will contact her with results and updated plan of care at that time. Patient verbalized understanding & provided callback number [phone].  Reviewed results with Kerry Hough who finds bhcg levels have plateaued and previous u/s is consistent with retained products. Patient can be offered expectant management or cytotec. Will need bhcg in a week.  Called patient and informed her of results and discussed options. Patient had questions about how we can tell if the ultrasound shows there is a potential for a baby versus pieces of the pregnancy that are left. Reviewed previous ultrasounds in detail with the patient. Discussed hormone levels are increasing/staying the same due to retained products not a progressing pregnancy. Patient verbalized understanding and requests medication. Provided medication instructions and when to go to the hospital. Discussed additional stronger pain medication will be sent in as well. Advised we will still want her to return for a follow up bhcg. Patient verbalized understanding and states she can come 12/28 @ 1030. Patient had no other questions.  Koren Bound RN BSN 05/14/19

## 2019-05-14 NOTE — Progress Notes (Signed)
Patient seen and assessed by nursing staff during this encounter. I have reviewed the chart and agree with the documentation and plan. I consulted with Dr. Hulan Fray, who agrees with plan of care.  Rx for Percocet and Phenergan sent to patient's pharmacy   Kerry Hough, PA-C 05/14/2019 5:04 PM

## 2019-05-21 ENCOUNTER — Other Ambulatory Visit: Payer: Self-pay | Admitting: Medical

## 2019-05-21 DIAGNOSIS — O039 Complete or unspecified spontaneous abortion without complication: Secondary | ICD-10-CM

## 2019-05-30 ENCOUNTER — Other Ambulatory Visit: Payer: Self-pay | Admitting: Medical

## 2019-05-30 DIAGNOSIS — O039 Complete or unspecified spontaneous abortion without complication: Secondary | ICD-10-CM

## 2019-05-30 NOTE — L&D Delivery Note (Addendum)
OB/GYN Faculty Practice Delivery Note  Staceyann Knouff is a 29 y.o. [id number] s/p spontaneous vaginal at [redacted]w[redacted]d. She was admitted for SOL.   GBS Status: Negative/-- (10/04 0917) Maximum Maternal Temperature: Temp (24hrs), Avg:98.8 F (37.1 C), Min:98 F (36.7 C), Max:99.1 F (37.3 C)  Labor Progress: Admitted in latent labor SROM 8h 64m prior to delivery with light meconium fluid  Complete dilation achieved.   Delivery Date/Time: 03/25/2020 at 1515 Delivery: Called to room and patient was complete and pushing. Head delivered ROA with loose nuchal x 1 and around body. Shoulder and body delivered in usual fashion. Infant with spontaneous cry, placed skin to skin on mother's abdomen, dried and stimulated. Cord clamped x 2 after 1-minute delay, and cut by FOB. Cord blood drawn. Placenta delivered spontaneously with gentle cord traction. Fundus firm with massage and Pitocin. Labia, perineum, vagina, and cervix inspected with  bilateral periurethral which were both hemostatic and did not require repair, as well as 1st degree sulcal that was repaired with 4-0 vicryl and achieved hemostasis.  .   Placenta: spontaneous , intact  Complications: none EBL: 250 mL  Analgesia: Epidural anesthesia  Postpartum Planning Mom and baby to mother/baby.  Lactation consult Contraception: pp BTL - scheduled for 10/29 afternoon. NPO at 10/29 @ 0600  Circ: desires   Social work: hx of anxiety and depression    Infant: Viable baby boy  APGAR: 8/9  see delivery summary for infant birthweight  Genia Hotter, M.D.  03/25/2020 3:50 PM  I was gloved and present for delivery of infant and placenta. I assisted resident with repair as noted above and agree with resident's note.  Sheila Oats, MD OB Fellow, Faculty Practice 03/25/2020 6:46 PM

## 2019-06-06 ENCOUNTER — Other Ambulatory Visit: Payer: Self-pay | Admitting: Medical

## 2019-06-06 DIAGNOSIS — O039 Complete or unspecified spontaneous abortion without complication: Secondary | ICD-10-CM

## 2019-06-15 ENCOUNTER — Other Ambulatory Visit: Payer: Self-pay | Admitting: Medical

## 2019-06-15 DIAGNOSIS — O039 Complete or unspecified spontaneous abortion without complication: Secondary | ICD-10-CM

## 2019-08-19 ENCOUNTER — Ambulatory Visit (INDEPENDENT_AMBULATORY_CARE_PROVIDER_SITE_OTHER): Payer: BC Managed Care – PPO | Admitting: Advanced Practice Midwife

## 2019-08-19 ENCOUNTER — Other Ambulatory Visit: Payer: Self-pay

## 2019-08-19 ENCOUNTER — Encounter: Payer: Self-pay | Admitting: Advanced Practice Midwife

## 2019-08-19 ENCOUNTER — Other Ambulatory Visit (HOSPITAL_COMMUNITY)
Admission: RE | Admit: 2019-08-19 | Discharge: 2019-08-19 | Disposition: A | Discharge status: Home / Self Care | Payer: BC Managed Care – PPO | Source: Ambulatory Visit | Attending: Family Medicine | Admitting: Family Medicine

## 2019-08-19 DIAGNOSIS — Z3481 Encounter for supervision of other normal pregnancy, first trimester: Secondary | ICD-10-CM | POA: Diagnosis not present

## 2019-08-19 DIAGNOSIS — Z3480 Encounter for supervision of other normal pregnancy, unspecified trimester: Secondary | ICD-10-CM | POA: Diagnosis present

## 2019-08-19 DIAGNOSIS — O34211 Maternal care for low transverse scar from previous cesarean delivery: Secondary | ICD-10-CM

## 2019-08-19 DIAGNOSIS — Z3687 Encounter for antenatal screening for uncertain dates: Secondary | ICD-10-CM

## 2019-08-19 DIAGNOSIS — Z3201 Encounter for pregnancy test, result positive: Secondary | ICD-10-CM

## 2019-08-19 LAB — POCT URINE PREGNANCY: Preg Test, Ur: POSITIVE — AB

## 2019-08-19 NOTE — Progress Notes (Signed)
DATING AND VIABILITY SONOGRAM   Vicki Ford is a 29 y.o. year old G3P0112 with LMP Patient's last menstrual period was 06/09/2019 (exact date). which would correlate to  [redacted]w[redacted]d weeks gestation.  She has regular menstrual cycles.   She is here today for a confirmatory initial sonogram.    GESTATION: SINGLETON     FETAL ACTIVITY:          Heart rate    172          The fetus is active.     GESTATIONAL AGE AND  BIOMETRICS:  Gestational criteria: Estimated Date of Delivery: 03/26/20 by early [place] now at [redacted]w[redacted]d  Previous Scans:0  GESTATIONAL SAC           3.56 cm         8-2 weeks  CROWN RUMP LENGTH           1.82 cm         8-6 weeks                                                                               AVERAGE EGA(BY THIS SCAN):  8-4weeks  WORKING EDD( LMP ):  03-26-2020     TECHNICIAN COMMENTS: Patient informed that the ultrasound is considered a limited obstetric ultrasound and is not intended to be a complete ultrasound exam. Patient also informed that the ultrasound is not being completed with the intent of assessing for fetal or placental anomalies or any pelvic abnormalities. Explained that the purpose of today's ultrasound is to assess for fetal heart rate. Patient acknowledges the purpose of the exam and the limitations of the study.      Armandina Stammer 08/19/2019 9:07 AM

## 2019-08-19 NOTE — Patient Instructions (Signed)
Genetic Testing During Pregnancy Genetic testing during pregnancy is also called prenatal genetic testing. This type of testing can determine if your baby is at risk of being born with a disorder caused by abnormal genes or chromosomes (genetic disorder). Chromosomes contain genes that control how your baby will develop in your womb. There are many different genetic disorders. Examples of genetic disorders that may be found through genetic testing include Down syndrome and cystic fibrosis. Gene changes (mutations) can be passed down through families. Genetic testing is offered to all women before or during pregnancy. You can choose whether to have genetic testing. Why is genetic testing done? Genetic testing is done during pregnancy to find out whether your child is at risk for a genetic disorder. Having genetic testing allows you to:  Discuss your test results and options with a genetic counselor.  Prepare for a baby that may be born with a genetic disorder. Learning about the disorder ahead of time helps you be better prepared to manage it. Your health care providers can also be prepared in case your baby requires special care before or after birth.  Consider whether you want to continue with the pregnancy. In some cases, genetic testing may be done to learn about the traits a child will inherit. Types of genetic tests There are two basic types of genetic testing. Screening tests indicate whether your developing baby (fetus) is at higher risk for a genetic disorder. Diagnostic tests check actual fetal cells to diagnose a genetic disorder. Screening tests     Screening tests will not harm your baby. They are recommended for all pregnant women. Types of screening tests include:  Carrier screening. This test involves checking genes from both parents by testing their blood or saliva. The test checks to find out if the parents carry a genetic mutation that may be passed to a baby. In most cases,  both parents must carry the mutation for a baby to be at risk.  First trimester screening. This test combines a blood test with sound wave imaging of your baby (fetal ultrasound). This screening test checks for a risk of Down syndrome or other defects caused by having extra chromosomes. It also checks for defects of the heart, abdomen, or skeleton.  Second trimester screening also combines a blood test with a fetal ultrasound exam. It checks for a risk of genetic defects of the face, brain, spine, heart, or limbs.  Combined or sequential screening. This type of testing combines the results of first and second trimester screening. This type of testing may be more accurate than first or second trimester screening alone.  Cell-free DNA testing. This is a blood test that detects cells released by the placenta that get into the mother's blood. It can be used to check for a risk of Down syndrome, other extra chromosome syndromes, and disorders caused by abnormal numbers of sex chromosomes. This test can be done any time after 10 weeks of pregnancy.  Diagnostic tests Diagnostic tests carry slight risks of problems, including bleeding, infection, and loss of the pregnancy. These tests are done only if your baby is at risk for a genetic disorder. You may meet with a genetic counselor to discuss the risks and benefits before having diagnostic tests. Examples of diagnostic tests include:  Chorionic villus sampling (CVS). This involves a procedure to remove and test a sample of cells taken from the placenta. The procedure may be done between 10 and 12 weeks of pregnancy.  Amniocentesis. This involves a   procedure to remove and test a sample of fluid (amniotic fluid) and cells from the sac that surrounds the developing baby. The procedure may be done between 15 and 20 weeks of pregnancy. What do the results mean? For a screening test:  If the results are negative, it often means that your child is not at higher  risk. There is still a slight chance your child could have a genetic disorder.  If the results are positive, it does not mean your child will have a genetic disorder. It may mean that your child has a higher-than-normal risk for a genetic disorder. In that case, you may want to talk with a genetic counselor about whether you should have diagnostic genetic tests. For a diagnostic test:  If the result is negative, it is unlikely that your child will have a genetic disorder.  If the test is positive for a genetic disorder, it is likely that your child will have the disorder. The test may not tell how severe the disorder will be. Talk with your health care provider about your options. Questions to ask your health care provider Before talking to your health care provider about genetic testing, find out if there is a history of genetic disorders in your family. It may also help to know your family's ethnic origins. Then ask your health care provider the following questions:  Is my baby at risk for a genetic disorder?  What are the benefits of having genetic screening?  What tests are best for me and my baby?  What are the risks of each test?  If I get a positive result on a screening test, what is the next step?  Should I meet with a genetic counselor before having a diagnostic test?  Should my partner or other members of my family be tested?  How much do the tests cost? Will my insurance cover the testing? Summary  Genetic testing is done during pregnancy to find out whether your child is at risk for a genetic disorder.  Genetic testing is offered to all women before or during pregnancy. You can choose whether to have genetic testing.  There are two basic types of genetic testing. Screening tests indicate whether your developing baby (fetus) is at higher risk for a genetic disorder. Diagnostic tests check actual fetal cells to diagnose a genetic disorder.  If a diagnostic genetic test is  positive, talk with your health care provider about your options. This information is not intended to replace advice given to you by your health care provider. Make sure you discuss any questions you have with your health care provider. Document Revised: 09/05/2018 Document Reviewed: 07/30/2017 Elsevier Patient Education  2020 [employer] First Trimester of Pregnancy The first trimester of pregnancy is from week 1 until the end of week 13 (months 1 through 3). A week after a sperm fertilizes an egg, the egg will implant on the wall of the uterus. This embryo will begin to develop into a baby. Genes from you and your partner will form the baby. The female genes will determine whether the baby will be a boy or a girl. At 6-8 weeks, the eyes and face will be formed, and the heartbeat can be seen on ultrasound. At the end of 12 weeks, all the baby's organs will be formed. Now that you are pregnant, you will want to do everything you can to have a healthy baby. Two of the most important things are to get good prenatal care and to   follow your health care provider's instructions. Prenatal care is all the medical care you receive before the baby's birth. This care will help prevent, find, and treat any problems during the pregnancy and childbirth. Body changes during your first trimester Your body goes through many changes during pregnancy. The changes vary from woman to woman.  You may gain or lose a couple of pounds at first.  You may feel sick to your stomach (nauseous) and you may throw up (vomit). If the vomiting is uncontrollable, call your health care provider.  You may tire easily.  You may develop headaches that can be relieved by medicines. All medicines should be approved by your health care provider.  You may urinate more often. Painful urination may mean you have a bladder infection.  You may develop heartburn as a result of your pregnancy.  You may develop constipation because certain  hormones are causing the muscles that push stool through your intestines to slow down.  You may develop hemorrhoids or swollen veins (varicose veins).  Your breasts may begin to grow larger and become tender. Your nipples may stick out more, and the tissue that surrounds them (areola) may become darker.  Your gums may bleed and may be sensitive to brushing and flossing.  Dark spots or blotches (chloasma, mask of pregnancy) may develop on your face. This will likely fade after the baby is born.  Your menstrual periods will stop.  You may have a loss of appetite.  You may develop cravings for certain kinds of food.  You may have changes in your emotions from day to day, such as being excited to be pregnant or being concerned that something may go wrong with the pregnancy and baby.  You may have more vivid and strange dreams.  You may have changes in your hair. These can include thickening of your hair, rapid growth, and changes in texture. Some women also have hair loss during or after pregnancy, or hair that feels dry or thin. Your hair will most likely return to normal after your baby is born. What to expect at prenatal visits During a routine prenatal visit:  You will be weighed to make sure you and the baby are growing normally.  Your blood pressure will be taken.  Your abdomen will be measured to track your baby's growth.  The fetal heartbeat will be listened to between weeks 10 and 14 of your pregnancy.  Test results from any previous visits will be discussed. Your health care provider may ask you:  How you are feeling.  If you are feeling the baby move.  If you have had any abnormal symptoms, such as leaking fluid, bleeding, severe headaches, or abdominal cramping.  If you are using any tobacco products, including cigarettes, chewing tobacco, and electronic cigarettes.  If you have any questions. Other tests that may be performed during your first trimester  include:  Blood tests to find your blood type and to check for the presence of any previous infections. The tests will also be used to check for low iron levels (anemia) and protein on red blood cells (Rh antibodies). Depending on your risk factors, or if you previously had diabetes during pregnancy, you may have tests to check for high blood sugar that affects pregnant women (gestational diabetes).  Urine tests to check for infections, diabetes, or protein in the urine.  An ultrasound to confirm the proper growth and development of the baby.  Fetal screens for spinal cord problems (spina bifida) and   Down syndrome.  HIV (human immunodeficiency virus) testing. Routine prenatal testing includes screening for HIV, unless you choose not to have this test.  You may need other tests to make sure you and the baby are doing well. Follow these instructions at home: Medicines  Follow your health care provider's instructions regarding medicine use. Specific medicines may be either safe or unsafe to take during pregnancy.  Take a prenatal vitamin that contains at least 600 micrograms (mcg) of folic acid.  If you develop constipation, try taking a stool softener if your health care provider approves. Eating and drinking   Eat a balanced diet that includes fresh fruits and vegetables, whole grains, good sources of protein such as meat, eggs, or tofu, and low-fat dairy. Your health care provider will help you determine the amount of weight gain that is right for you.  Avoid raw meat and uncooked cheese. These carry germs that can cause birth defects in the baby.  Eating four or five small meals rather than three large meals a day may help relieve nausea and vomiting. If you start to feel nauseous, eating a few soda crackers can be helpful. Drinking liquids between meals, instead of during meals, also seems to help ease nausea and vomiting.  Limit foods that are high in fat and processed sugars, such  as fried and sweet foods.  To prevent constipation: ? Eat foods that are high in fiber, such as fresh fruits and vegetables, whole grains, and beans. ? Drink enough fluid to keep your urine clear or pale yellow. Activity  Exercise only as directed by your health care provider. Most women can continue their usual exercise routine during pregnancy. Try to exercise for 30 minutes at least 5 days a week. Exercising will help you: ? Control your weight. ? Stay in shape. ? Be prepared for labor and delivery.  Experiencing pain or cramping in the lower abdomen or lower back is a good sign that you should stop exercising. Check with your health care provider before continuing with normal exercises.  Try to avoid standing for long periods of time. Move your legs often if you must stand in one place for a long time.  Avoid heavy lifting.  Wear low-heeled shoes and practice good posture.  You may continue to have sex unless your health care provider tells you not to. Relieving pain and discomfort  Wear a good support bra to relieve breast tenderness.  Take warm sitz baths to soothe any pain or discomfort caused by hemorrhoids. Use hemorrhoid cream if your health care provider approves.  Rest with your legs elevated if you have leg cramps or low back pain.  If you develop varicose veins in your legs, wear support hose. Elevate your feet for 15 minutes, 3-4 times a day. Limit salt in your diet. Prenatal care  Schedule your prenatal visits by the twelfth week of pregnancy. They are usually scheduled monthly at first, then more often in the last 2 months before delivery.  Write down your questions. Take them to your prenatal visits.  Keep all your prenatal visits as told by your health care provider. This is important. Safety  Wear your seat belt at all times when driving.  Make a list of emergency phone numbers, including numbers for family, friends, the hospital, and police and fire  departments. General instructions  Ask your health care provider for a referral to a local prenatal education class. Begin classes no later than the beginning of month 6 of your   pregnancy.  Ask for help if you have counseling or nutritional needs during pregnancy. Your health care provider can offer advice or refer you to specialists for help with various needs.  Do not use hot tubs, steam rooms, or saunas.  Do not douche or use tampons or scented sanitary pads.  Do not cross your legs for long periods of time.  Avoid cat litter boxes and soil used by cats. These carry germs that can cause birth defects in the baby and possibly loss of the fetus by miscarriage or stillbirth.  Avoid all smoking, herbs, alcohol, and medicines not prescribed by your health care provider. Chemicals in these products affect the formation and growth of the baby.  Do not use any products that contain nicotine or tobacco, such as cigarettes and e-cigarettes. If you need help quitting, ask your health care provider. You may receive counseling support and other resources to help you quit.  Schedule a dentist appointment. At home, brush your teeth with a soft toothbrush and be gentle when you floss. Contact a health care provider if:  You have dizziness.  You have mild pelvic cramps, pelvic pressure, or nagging pain in the abdominal area.  You have persistent nausea, vomiting, or diarrhea.  You have a bad smelling vaginal discharge.  You have pain when you urinate.  You notice increased swelling in your face, hands, legs, or ankles.  You are exposed to fifth disease or chickenpox.  You are exposed to German measles (rubella) and have never had it. Get help right away if:  You have a fever.  You are leaking fluid from your vagina.  You have spotting or bleeding from your vagina.  You have severe abdominal cramping or pain.  You have rapid weight gain or loss.  You vomit blood or material that  looks like coffee grounds.  You develop a severe headache.  You have shortness of breath.  You have any kind of trauma, such as from a fall or a car accident. Summary  The first trimester of pregnancy is from week 1 until the end of week 13 (months 1 through 3).  Your body goes through many changes during pregnancy. The changes vary from woman to woman.  You will have routine prenatal visits. During those visits, your health care provider will examine you, discuss any test results you may have, and talk with you about how you are feeling. This information is not intended to replace advice given to you by your health care provider. Make sure you discuss any questions you have with your health care provider. Document Revised: 04/27/2017 Document Reviewed: 04/26/2016 Elsevier Patient Education  2020 [employer] Inc[employer]  

## 2019-08-19 NOTE — Progress Notes (Signed)
hepuri

## 2019-08-19 NOTE — Progress Notes (Signed)
Subjective:    Vicki Ford is a [id number] [redacted]w[redacted]d being seen today for her first obstetrical visit.  Her obstetrical history is significant for recent SAB, prior C/S for twins. Patient does intend to breast feed. Pregnancy history fully reviewed.    Had C/S for twins "because second twin was breech and I had PUPPS, so they recommended I deliver at [facility]".  States did not labor long, never dilated before having C/S  Patient reports nausea., but does not want meds.    Vitals:   08/19/19 0837 08/19/19 0838  BP: 110/69   Pulse: 98   Weight: 197 lb (89.4 kg)   Height:  5\' 1"  (1.549 m)    HISTORY: OB History  Gravida Para Term Preterm AB Living  3 1 0 1 1 2   SAB TAB Ectopic Multiple Live Births  1 0 0 1 2    # Outcome Date GA Lbr Len/2nd Weight Sex Delivery Anes PTL Lv  3 Current           2 SAB 2021          1A Preterm 2014     CS-LTranv   LIV  1B Preterm      CS-LTranv   LIV   Past Medical History:  Diagnosis Date  . Anxiety    takes zoloft, buspar, & atarax  . Asthma    sports induced asthma  . Depression    takes zoloft & buspar  . Headache   . Preterm labor    Past Surgical History:  Procedure Laterality Date  . CESAREAN SECTION    . GALLBLADDER SURGERY  01/15/2019  . HERNIA REPAIR     Family History  Problem Relation Age of Onset  . Cancer Mother   . Depression Mother   . ADD / ADHD Sister   . Depression Sister      Exam    Uterus:     Pelvic Exam:    Perineum: No Hemorrhoids   Vulva: Bartholin's, Urethra, Skene's normal   Vagina:  normal mucosa, normal discharge   pH:    Cervix: no bleeding following Pap   Adnexa: normal adnexa and no mass, fullness, tenderness   Bony Pelvis: gynecoid  System: Breast:  normal appearance, no masses or tenderness   Skin: normal coloration and turgor, no rashes    Neurologic: oriented, grossly non-focal   Extremities: normal strength, tone, and muscle mass   HEENT neck supple with midline trachea   Mouth/Teeth  mucous membranes moist, pharynx normal without lesions   Neck supple   Cardiovascular: regular rate and rhythm   Respiratory:  appears well, vitals normal, no respiratory distress, acyanotic, normal RR, ear and throat exam is normal, neck free of mass or lymphadenopathy, chest clear, no wheezing, crepitations, rhonchi, normal symmetric air entry   Abdomen: soft, non-tender; bowel sounds normal; no masses,  no organomegaly   Urinary: urethral meatus normal      Assessment:    Pregnancy: [id number] Patient Active Problem List   Diagnosis Date Noted  . Supervision of other normal pregnancy, antepartum 08/19/2019  . PTSD (post-traumatic stress disorder) 07/18/2017  . MDD (major depressive disorder), recurrent severe, without psychosis ([place]) 12/26/2013  . Sciatica 12/26/2013  . Attention or concentration deficit 02/08/2010  . Migraine 02/08/2010  . Allergic rhinitis 07/22/2008  . Exercise-induced asthma 07/22/2008        Plan:     Initial labs drawn. Prenatal vitamins. Problem list reviewed and updated. Genetic Screening discussed Integrated Screen:  requested. Will think about it  Ultrasound discussed; fetal survey: requested.  Follow up in 4 weeks. 50% of 30 min visit spent on counseling and coordination of care.  Welcomed to practice Routines reviewed Discussed TOLAC/Repeat C/S.  Undecided. Will have her discuss with  MD on future visit   Wynelle Bourgeois 08/19/2019

## 2019-08-20 LAB — CYTOLOGY - PAP
Chlamydia: NEGATIVE
Comment: NEGATIVE
Comment: NEGATIVE
Comment: NORMAL
Diagnosis: NEGATIVE
High risk HPV: NEGATIVE
Neisseria Gonorrhea: NEGATIVE

## 2019-08-21 ENCOUNTER — Encounter: Payer: BC Managed Care – PPO | Admitting: Family Medicine

## 2019-08-21 LAB — CULTURE, OB URINE

## 2019-08-21 LAB — URINE CULTURE, OB REFLEX: Organism ID, Bacteria: NO GROWTH

## 2019-08-26 LAB — OBSTETRIC PANEL, INCLUDING HIV
Antibody Screen: NEGATIVE
Basophils Absolute: 0 10*3/uL (ref 0.0–0.2)
Basos: 0 %
EOS (ABSOLUTE): 0 10*3/uL (ref 0.0–0.4)
Eos: 0 %
HIV Screen 4th Generation wRfx: NONREACTIVE
Hematocrit: 39.2 % (ref 34.0–46.6)
Hemoglobin: 13.3 g/dL (ref 11.1–15.9)
Hepatitis B Surface Ag: NEGATIVE
Immature Grans (Abs): 0.1 10*3/uL (ref 0.0–0.1)
Immature Granulocytes: 1 %
Lymphocytes Absolute: 1.8 10*3/uL (ref 0.7–3.1)
Lymphs: 18 %
MCH: 29 pg (ref 26.6–33.0)
MCHC: 33.9 g/dL (ref 31.5–35.7)
MCV: 86 fL (ref 79–97)
Monocytes Absolute: 0.4 10*3/uL (ref 0.1–0.9)
Monocytes: 5 %
Neutrophils Absolute: 7.3 10*3/uL — ABNORMAL HIGH (ref 1.4–7.0)
Neutrophils: 76 %
Platelets: 275 10*3/uL (ref 150–450)
RBC: 4.58 x10E6/uL (ref 3.77–5.28)
RDW: 13.7 % (ref 11.7–15.4)
RPR Ser Ql: NONREACTIVE
Rh Factor: POSITIVE
Rubella Antibodies, IGG: 6.37 index (ref >0.99)
WBC: 9.6 10*3/uL (ref 3.4–10.8)

## 2019-08-26 LAB — SMN1 COPY NUMBER ANALYSIS (SMA CARRIER SCREENING)

## 2019-08-26 LAB — HEMOGLOBPATHY+FER W/A THAL RFX
Ferritin: 121 ng/mL (ref 15–150)
Hgb A2: 2.3 % (ref 1.8–3.2)
Hgb A: 97.7 % (ref 96.4–98.8)
Hgb F: 0 % (ref 0.0–2.0)
Hgb S: 0 %

## 2019-08-26 LAB — HEPATITIS C ANTIBODY: Hep C Virus Ab: <0.1 s/co ratio (ref 0.0–0.9)

## 2019-08-26 LAB — CYSTIC FIBROSIS GENE TEST

## 2019-09-15 ENCOUNTER — Ambulatory Visit (INDEPENDENT_AMBULATORY_CARE_PROVIDER_SITE_OTHER): Payer: BC Managed Care – PPO | Admitting: Obstetrics & Gynecology

## 2019-09-15 ENCOUNTER — Other Ambulatory Visit: Payer: Self-pay

## 2019-09-15 VITALS — BP 112/65 | HR 98 | Wt 194.0 lb

## 2019-09-15 DIAGNOSIS — O99341 Other mental disorders complicating pregnancy, first trimester: Secondary | ICD-10-CM

## 2019-09-15 DIAGNOSIS — Z3480 Encounter for supervision of other normal pregnancy, unspecified trimester: Secondary | ICD-10-CM

## 2019-09-15 DIAGNOSIS — F4310 Post-traumatic stress disorder, unspecified: Secondary | ICD-10-CM

## 2019-09-15 DIAGNOSIS — F332 Major depressive disorder, recurrent severe without psychotic features: Secondary | ICD-10-CM

## 2019-09-15 DIAGNOSIS — Z3A12 12 weeks gestation of pregnancy: Secondary | ICD-10-CM

## 2019-09-15 NOTE — Progress Notes (Signed)
   PRENATAL VISIT NOTE  Subjective:  Vicki Ford is a 29 y.o. [phone] at [redacted]w[redacted]d being seen today for ongoing prenatal care.  She is currently monitored for the following issues for this high-risk pregnancy and has Supervision of other normal pregnancy, antepartum; Allergic rhinitis; Attention or concentration deficit; Exercise-induced asthma; PTSD (post-traumatic stress disorder); MDD (major depressive disorder), recurrent severe, without psychosis (HCC); Migraine; Sciatica; and Twin liveborn by cesarean on their problem list.  Patient reports starting to have itching on the plantar surface of her hands.  .  Contractions: Not present. Vag. Bleeding: None.  Movement: Absent. Denies leaking of fluid.   The following portions of the patient's history were reviewed and updated as appropriate: allergies, current medications, past family history, past medical history, past social history, past surgical history and problem list.   Objective:   Vitals:   09/15/19 0950  BP: 112/65  Pulse: 98  Weight: 194 lb (88 kg)    Fetal Status: Fetal Heart Rate (bpm): 159   Movement: Absent     General:  Alert, oriented and cooperative. Patient is in no acute distress.  Skin: Skin is warm and dry. No rash noted.   Cardiovascular: Normal heart rate noted  Respiratory: Normal respiratory effort, no problems with respiration noted  Abdomen: Soft, gravid, appropriate for gestational age.  Pain/Pressure: Absent     Pelvic: Cervical exam deferred        Extremities: Normal range of motion.  Edema: None  Mental Status: Normal mood and affect. Normal behavior. Normal judgment and thought content.   Assessment and Plan:  Pregnancy: [id number] at [redacted]w[redacted]d 1. Supervision of other normal pregnancy, antepartum NIPS today   2. Twin liveborn by cesarean Desires TOLAC Review TOLAC risks vs benefits. Consent signed.   3. MDD (major depressive disorder), recurrent severe, without psychosis (HCC)  4. PTSD (post-traumatic  stress disorder)  Preterm labor symptoms and general obstetric precautions including but not limited to vaginal bleeding, contractions, leaking of fluid and fetal movement were reviewed in detail with the patient. Please refer to After Visit Summary for other counseling recommendations.   Return in about 4 weeks (around 10/13/2019).  No future appointments.  Willodean Rosenthal, MD

## 2019-09-22 DIAGNOSIS — Z3480 Encounter for supervision of other normal pregnancy, unspecified trimester: Secondary | ICD-10-CM

## 2019-10-13 ENCOUNTER — Other Ambulatory Visit: Payer: Self-pay

## 2019-10-13 ENCOUNTER — Ambulatory Visit (INDEPENDENT_AMBULATORY_CARE_PROVIDER_SITE_OTHER): Payer: BC Managed Care – PPO | Admitting: Obstetrics & Gynecology

## 2019-10-13 VITALS — BP 113/63 | HR 95 | Wt 193.0 lb

## 2019-10-13 DIAGNOSIS — M5431 Sciatica, right side: Secondary | ICD-10-CM

## 2019-10-13 DIAGNOSIS — O99619 Diseases of the digestive system complicating pregnancy, unspecified trimester: Secondary | ICD-10-CM | POA: Insufficient documentation

## 2019-10-13 DIAGNOSIS — M5432 Sciatica, left side: Secondary | ICD-10-CM

## 2019-10-13 DIAGNOSIS — L299 Pruritus, unspecified: Secondary | ICD-10-CM

## 2019-10-13 DIAGNOSIS — O2686 Pruritic urticarial papules and plaques of pregnancy (PUPPP): Secondary | ICD-10-CM

## 2019-10-13 DIAGNOSIS — Z3480 Encounter for supervision of other normal pregnancy, unspecified trimester: Secondary | ICD-10-CM

## 2019-10-13 DIAGNOSIS — K219 Gastro-esophageal reflux disease without esophagitis: Secondary | ICD-10-CM

## 2019-10-13 DIAGNOSIS — Z98891 History of uterine scar from previous surgery: Secondary | ICD-10-CM

## 2019-10-13 MED ORDER — HYDROXYZINE HCL 25 MG PO TABS: 25.0000 mg | ORAL_TABLET | Freq: Four times a day (QID) | ORAL | 2 refills | Status: DC | PRN

## 2019-10-13 MED ORDER — LANSOPRAZOLE 15 MG PO CPDR: 15.0000 mg | DELAYED_RELEASE_CAPSULE | Freq: Every day | ORAL | 2 refills | Status: DC

## 2019-10-13 NOTE — Progress Notes (Signed)
   PRENATAL VISIT NOTE  Subjective:  Naysha Sholl is a 29 y.o. [phone] at [redacted]w[redacted]d being seen today for ongoing prenatal care.  She is currently monitored for the following issues for this low-risk pregnancy and has Supervision of other normal pregnancy, antepartum; Allergic rhinitis; Attention or concentration deficit; Exercise-induced asthma; PTSD (post-traumatic stress disorder); MDD (major depressive disorder), recurrent severe, without psychosis (HCC); Migraine; Sciatica; Twin liveborn by cesarean; History of cesarean delivery; Gastroesophageal reflux during pregnancy, antepartum; and PUPP (pruritic urticarial papules and plaques of pregnancy) on their problem list.  Patient reports continued severe itching of hands and feet that is worse at night. She also reports that the sciatica that she had on the left is now on the right and she has a hard time walking distances.   Contractions: Not present. Vag. Bleeding: None.  Movement: Present. Denies leaking of fluid.   The following portions of the patient's history were reviewed and updated as appropriate: allergies, current medications, past family history, past medical history, past social history, past surgical history and problem list.   Objective:   Vitals:   10/13/19 1107  BP: 113/63  Pulse: 95  Weight: 193 lb (87.5 kg)    Fetal Status: Fetal Heart Rate (bpm): 145   Movement: Present     General:  Alert, oriented and cooperative. Patient is in no acute distress.  Skin: Skin is warm and dry. No rash noted.   Cardiovascular: Normal heart rate noted  Respiratory: Normal respiratory effort, no problems with respiration noted  Abdomen: Soft, gravid, appropriate for gestational age.  Pain/Pressure: Present     Pelvic: Cervical exam deferred        Extremities: Normal range of motion.  Edema: None  Mental Status: Normal mood and affect. Normal behavior. Normal judgment and thought content.   Assessment and Plan:  Pregnancy: [id number] at  [redacted]w[redacted]d 1. Supervision of other normal pregnancy, antepartum  - AFP, Serum, Open Spina Bifida - [place] MFM OB DETAIL +14 WK; Future  2. History of cesarean delivery Desires TOLAC  3. PUPPs Will begin vistaril  Will obtain a Lactic acid level  4. Reflux Prevacid 30 mg daily  5. Sciatica- bilateral Pelvic PT   Preterm labor symptoms and general obstetric precautions including but not limited to vaginal bleeding, contractions, leaking of fluid and fetal movement were reviewed in detail with the patient. Please refer to After Visit Summary for other counseling recommendations.   Return in about 4 weeks (around 11/10/2019) for in person.  Future Appointments  Date Time Provider Department Center  11/13/2019 10:15 AM Levie Heritage, DO [facility] None    Willodean Rosenthal, MD

## 2019-10-14 LAB — LACTIC ACID, PLASMA: Lactate, Ven: 11.5 mg/dL (ref 4.8–25.7)

## 2019-10-15 LAB — AFP, SERUM, OPEN SPINA BIFIDA
AFP MoM: 0.89
AFP Value: 27.3 ng/mL
Gest. Age on Collection Date: 16.3 weeks
Maternal Age At EDD: 31.7 yr
OSBR Risk 1 IN: 10000
Test Results:: NEGATIVE
Weight: 193 [lb_av]

## 2019-10-24 ENCOUNTER — Ambulatory Visit: Payer: BC Managed Care – PPO | Attending: Obstetrics & Gynecology | Admitting: Physical Therapy

## 2019-10-24 ENCOUNTER — Encounter: Payer: Self-pay | Admitting: Physical Therapy

## 2019-10-24 ENCOUNTER — Other Ambulatory Visit: Payer: Self-pay

## 2019-10-24 DIAGNOSIS — R262 Difficulty in walking, not elsewhere classified: Secondary | ICD-10-CM | POA: Diagnosis present

## 2019-10-24 DIAGNOSIS — M5441 Lumbago with sciatica, right side: Secondary | ICD-10-CM | POA: Insufficient documentation

## 2019-10-24 DIAGNOSIS — R293 Abnormal posture: Secondary | ICD-10-CM | POA: Diagnosis present

## 2019-10-24 DIAGNOSIS — M5442 Lumbago with sciatica, left side: Secondary | ICD-10-CM | POA: Diagnosis not present

## 2019-10-24 DIAGNOSIS — G8929 Other chronic pain: Secondary | ICD-10-CM | POA: Diagnosis present

## 2019-10-24 NOTE — Therapy (Signed)
[facility] [address]  [place], [place], [address] Phone: [phone]   Fax:  [phone]  Physical Therapy Evaluation  Patient Details  Name: Vicki Ford MRN: [id number] Date of Birth: 1990/06/12 Referring Provider (PT): Lavonia Drafts, MD   Encounter Date: 10/24/2019  PT End of Session - 10/24/19 0937    Visit Number  1    Number of Visits  7    Date for PT Re-Evaluation  12/05/19    Authorization Type  BCBS    PT Start Time  0852    PT Stop Time  0926    PT Time Calculation (min)  34 min    Activity Tolerance  Patient tolerated treatment well;Patient limited by pain    Behavior During Therapy  [facility] for tasks assessed/performed       Past Medical History:  Diagnosis Date  . Anxiety    takes zoloft, buspar, & atarax  . Asthma    sports induced asthma  . Depression    takes zoloft & buspar  . Headache   . Preterm labor     Past Surgical History:  Procedure Laterality Date  . CESAREAN SECTION    . GALLBLADDER SURGERY  01/15/2019  . HERNIA REPAIR      There were no vitals filed for this visit.   Subjective Assessment - 10/24/19 0853    Subjective  Patient reports that she is 5 months pregnant. 2-3 months into her current pregnancy, she started to have back pain. Pain occurs in B LB and radiates to midback and down to B feet. Denies N/T or B&B changes. Does have a burning sensation in her LEs and feet which occurs with prolonged standing such as when cooking or prolonged walking. Feels that her back gets locked in place when she tries to lean forward, followed by a strong pinching when she tries to lean back to neutral. Most comfortable on side or laying supine with legs sticking up in the air. Denies any high risk factors/problems with current pregnancy.    Pertinent History  HA, depression, asthma, anxiety    Limitations  Lifting;Standing;Walking;House hold activities    How long can you  stand comfortably?  1 hour    How long can you walk comfortably?  1 hour    Diagnostic tests  none    Patient Stated Goals  work on strength and flexibility    Currently in Pain?  Yes    Pain Score  5     Pain Location  Back    Pain Orientation  Left;Lower    Pain Descriptors / Indicators  Dull    Pain Type  Acute pain         [facility] PT Assessment - 10/24/19 0902      Assessment   Medical Diagnosis  B Sciatica    Referring Provider (PT)  Lavonia Drafts, MD    Onset Date/Surgical Date  08/24/19    Next MD Visit  pt unsure    Prior Therapy  no      Precautions   Precautions  --   [redacted] weeks pregnant     Balance Screen   Has the patient fallen in the past 6 months  No    Has the patient had a decrease in activity level because of a fear of falling?   No    Is the patient reluctant to leave their home because of a fear of falling?  No      Home Nurse, mental health  Private residence    Living Arrangements  Spouse/significant other;Children    Available Help at Discharge  Family    Type of Home  Apartment    Home Access  Stairs to enter    Entrance Stairs-Number of Steps  20    Entrance Stairs-Rails  Right;Left    Home Layout  One level    Home Equipment  None      Prior Function   Level of Independence  Independent    Vocation  Full time employment    Vocation Requirements  working from home    Leisure  walk on sand, go to pool, keep up with kids      Cognition   Overall Cognitive Status  Within Functional Limits for tasks assessed      Sensation   Light Touch  Appears Intact      Coordination   Gross Motor Movements are Fluid and Coordinated  Yes      Posture/Postural Control   Posture/Postural Control  Postural limitations    Postural Limitations  Rounded Shoulders;Increased lumbar lordosis      ROM / Strength   AROM / PROM / Strength  Strength      Strength   Strength Assessment Site  Hip;Knee;Ankle    Right/Left Hip  Right;Left     Right Hip Flexion  4+/5    Right Hip ABduction  4+/5    Right Hip ADduction  4/5    Left Hip Flexion  4+/5    Left Hip ABduction  4+/5    Left Hip ADduction  4/5    Right/Left Knee  Right;Left    Right Knee Flexion  4/5    Right Knee Extension  4+/5    Left Knee Flexion  4/5    Left Knee Extension  4+/5    Right/Left Ankle  Right;Left    Right Ankle Dorsiflexion  4+/5    Right Ankle Plantar Flexion  4/5    Left Ankle Dorsiflexion  4+/5    Left Ankle Plantar Flexion  4/5      Flexibility   Soft Tissue Assessment /Muscle Length  yes    Hamstrings  B LEs WNL    Quadriceps  B mildly tight in mod thomas    Piriformis  B moderately tight      Palpation   SI assessment   L ASIS & PSIS elevated but without tenderness    Palpation comment  TTP over midline of lumbar spine, proximal glutes, piriformis      Ambulation/Gait   Assistive device  None    Gait Pattern  Step-through pattern;Lateral hip instability    Ambulation Surface  Level;Indoor    Gait velocity  WNL                  Objective measurements completed on examination: See above findings.              PT Education - 10/24/19 0937    Education Details  prognosis, POC, HEP    Person(s) Educated  Patient    Methods  Explanation;Demonstration;Tactile cues;Verbal cues;Handout    Comprehension  Verbalized understanding;Returned demonstration       PT Short Term Goals - 10/24/19 0943      PT SHORT TERM GOAL #1   Title  Patient to be independent with initial HEP.    Time  3    Period  Weeks  Status  New    Target Date  11/14/19        PT Long Term Goals - 10/24/19 0943      PT LONG TERM GOAL #1   Title  Patient to be independent with advanced HEP.    Time  6    Period  Weeks    Status  New    Target Date  12/05/19      PT LONG TERM GOAL #2   Title  Patient to demonstrate B LE strength >/=4+/5.    Time  6    Period  Weeks    Status  New    Target Date  12/05/19      PT LONG TERM  GOAL #3   Title  Patient to demonstrate mild B piriformis & hip flexor tightness remaining.    Time  6    Period  Weeks    Status  New    Target Date  12/05/19      PT LONG TERM GOAL #4   Title  Patient to report understanding of edu on posture and body mechanics for improved comfort with ADLs.    Time  6    Period  Weeks    Status  New    Target Date  12/05/19      PT LONG TERM GOAL #5   Title  Patient to report 70% improvement in pain levels.    Time  6    Period  Weeks    Status  New    Target Date  12/05/19             Plan - 10/24/19 1914    Clinical Impression Statement  Patient is a 29y/o F, [redacted] weeks pregnant, presenting to OPPT with c/o insidious B LBP of 2-3 months duration. Pain occurs in B LB and radiates to midback and down to B feet. Denies N/T or B&B changes. Endorses a burning sensation in her LEs and feet which occurs with prolonged standing or walking. Patient today presenting with increased lumbar lordosis and rounded shoulders, decreased LE strength, tightness in B hip flexors and piriformis, elevated L PSIS and ASIS, TTP over midline of lumbar spine, proximal glutes, piriformis, and gait deviations. Patient educated on gentle stretching and lumbopelvic ROM HEP- patient reported understanding. Would benefit from skilled PT services 1x/week for 6 weeks to address aforementioned impairments.    Personal Factors and Comorbidities  Age;Sex;Comorbidity 3+;Past/Current Experience;Fitness;Profession;Time since onset of injury/illness/exacerbation    Comorbidities  HA, depression, asthma, anxiety, current pregnancy    Examination-Activity Limitations  Sleep;Bed Mobility;Bend;Squat;Stairs;Caring for Others;Carry;Stand;Dressing;Transfers;Hygiene/Grooming;Lift;Locomotion Level;Reach Overhead    Examination-Participation Restrictions  Church;Cleaning;Shop;Community Activity;Driving;Interpersonal Relationship;Laundry;Meal Prep    Stability/Clinical Decision Making   Evolving/Moderate complexity    Clinical Decision Making  Moderate    Rehab Potential  Good    PT Frequency  1x / week    PT Duration  6 weeks    PT Treatment/Interventions  ADLs/Self Care Home Management;Cryotherapy;Moist Heat;Balance training;Therapeutic exercise;Therapeutic activities;Functional mobility training;Stair training;Gait training;Neuromuscular re-education;Patient/family education;Manual techniques;Taping;Energy conservation;Dry needling;Passive range of motion    PT Next Visit Plan  lumbar FOTO; reassess HEP; core and hip strengthening, piriformis stretching    Consulted and Agree with Plan of Care  Patient       Patient will benefit from skilled therapeutic intervention in order to improve the following deficits and impairments:  Abnormal gait, Decreased activity tolerance, Decreased strength, Pain, Difficulty walking, Increased muscle spasms, Improper body mechanics, Decreased range of motion, Impaired  flexibility, Postural dysfunction  Visit Diagnosis: Chronic bilateral low back pain with bilateral sciatica  Difficulty in walking, not elsewhere classified  Abnormal posture     Problem List Patient Active Problem List   Diagnosis Date Noted  . History of cesarean delivery 10/13/2019  . Gastroesophageal reflux during pregnancy, antepartum 10/13/2019  . PUPP (pruritic urticarial papules and plaques of pregnancy) 10/13/2019  . Supervision of other normal pregnancy, antepartum 08/19/2019  . Twin liveborn by cesarean 08/19/2019  . PTSD (post-traumatic stress disorder) 07/18/2017  . MDD (major depressive disorder), recurrent severe, without psychosis (HCC) 12/26/2013  . Sciatica 12/26/2013  . Attention or concentration deficit 02/08/2010  . Migraine 02/08/2010  . Allergic rhinitis 07/22/2008  . Exercise-induced asthma 07/22/2008     Anette Guarneri, PT, DPT 10/24/19 9:47 AM   [facility] [address]  [address] [place], [place], [address] Phone: [phone]   Fax:  [phone]  Name: Vicki Ford MRN: [id number] Date of Birth: 1990/10/24

## 2019-10-28 ENCOUNTER — Other Ambulatory Visit: Payer: Self-pay

## 2019-10-28 ENCOUNTER — Encounter: Payer: Self-pay | Admitting: Physical Therapy

## 2019-10-28 ENCOUNTER — Ambulatory Visit: Payer: BC Managed Care – PPO | Attending: Obstetrics & Gynecology | Admitting: Physical Therapy

## 2019-10-28 DIAGNOSIS — M5441 Lumbago with sciatica, right side: Secondary | ICD-10-CM | POA: Insufficient documentation

## 2019-10-28 DIAGNOSIS — G8929 Other chronic pain: Secondary | ICD-10-CM

## 2019-10-28 DIAGNOSIS — M5442 Lumbago with sciatica, left side: Secondary | ICD-10-CM | POA: Insufficient documentation

## 2019-10-28 DIAGNOSIS — R262 Difficulty in walking, not elsewhere classified: Secondary | ICD-10-CM | POA: Diagnosis present

## 2019-10-28 DIAGNOSIS — R293 Abnormal posture: Secondary | ICD-10-CM | POA: Diagnosis present

## 2019-10-28 NOTE — Therapy (Signed)
[facility] [address]  [address] [place], [place], [address] Phone: [phone]   Fax:  [phone]  Physical Therapy Treatment  Patient Details  Name: Vicki Ford MRN: [id number] Date of Birth: 23-Apr-1991 Referring Provider (PT): Willodean Rosenthal, MD   Encounter Date: 10/28/2019  PT End of Session - 10/28/19 0849    Visit Number  2    Number of Visits  7    Date for PT Re-Evaluation  12/05/19    Authorization Type  BCBS    PT Start Time  [phone]    PT Stop Time  0932    PT Time Calculation (min)  43 min    Activity Tolerance  Patient tolerated treatment well    Behavior During Therapy  [facility] for tasks assessed/performed       Past Medical History:  Diagnosis Date  . Anxiety    takes zoloft, buspar, & atarax  . Asthma    sports induced asthma  . Depression    takes zoloft & buspar  . Headache   . Preterm labor     Past Surgical History:  Procedure Laterality Date  . CESAREAN SECTION    . GALLBLADDER SURGERY  01/15/2019  . HERNIA REPAIR      There were no vitals filed for this visit.  Subjective Assessment - 10/28/19 0852    Subjective  Pt reports pain migrates L to R and back, sometimes on both sides. Notes some relief with HEP but does not take the full pain away.    Pertinent History  HA, depression, asthma, anxiety    Patient Stated Goals  work on strength and flexibility    Currently in Pain?  Yes    Pain Location  Buttocks    Pain Orientation  Left    Pain Descriptors / Indicators  Stabbing;Burning   "pinching" & "constricting"   Pain Type  Acute pain    Pain Radiating Towards  pain down L leg to feet    Pain Frequency  Intermittent                        [facility] Adult PT Treatment/Exercise - 10/28/19 0849      Exercises   Exercises  Lumbar      Lumbar Exercises: Stretches   Quadruped Mid Back Stretch  30 seconds;3 reps    Quadruped Mid Back Stretch Limitations  seated  3-way prayer stretch with green Pball    Piriformis Stretch  Right;Left;30 seconds;2 reps    Piriformis Stretch Limitations  seated KTOS    Figure 4 Stretch  30 seconds;2 reps;Seated;With overpressure    Figure 4 Stretch Limitations  slight hip hinge       Lumbar Exercises: Aerobic   Recumbent Bike  L1 x 6 min      Lumbar Exercises: Seated   Other Seated Lumbar Exercises  Pelvic tilt 10 x 5"    Other Seated Lumbar Exercises  Hip adduction isometric pillow squeeze 10 x 10"; Red TB clam 10 x 5"      Manual Therapy   Manual Therapy  Soft tissue mobilization;Myofascial release    Manual therapy comments  R side lying with L LE supported on bolster    Soft tissue mobilization  STM/DTM to B lower lumbar paraspinals, L glutes & piriformis    Myofascial Release  manual TPR to L mid/lateral glute minimus & medius  PT Short Term Goals - 10/28/19 1049      PT SHORT TERM GOAL #1   Title  Patient to be independent with initial HEP.    Time  3    Period  Weeks    Status  On-going    Target Date  11/14/19        PT Long Term Goals - 10/28/19 1050      PT LONG TERM GOAL #1   Title  Patient to be independent with advanced HEP.    Time  6    Period  Weeks    Status  On-going    Target Date  12/05/19      PT LONG TERM GOAL #2   Title  Patient to demonstrate B LE strength >/=4+/5.    Time  6    Period  Weeks    Status  On-going    Target Date  12/05/19      PT LONG TERM GOAL #3   Title  Patient to demonstrate mild B piriformis & hip flexor tightness remaining.    Time  6    Period  Weeks    Status  On-going    Target Date  12/05/19      PT LONG TERM GOAL #4   Title  Patient to report understanding of edu on posture and body mechanics for improved comfort with ADLs.    Time  6    Period  Weeks    Status  On-going    Target Date  12/05/19      PT LONG TERM GOAL #5   Title  Patient to report 70% improvement in pain levels.    Time  6    Period  Weeks     Status  On-going    Target Date  12/05/19            Plan - 10/28/19 1050    Clinical Impression Statement  Summerlyn reports LBP and buttock pain will migrate laterally L<>R with radiculopathy changing as well - currently worse on L. Given proximity of reported pain to SIJ, assess SIJ alignment with alignment appearing neutral. HEP reviewed with pt reporting she had not attempted hip adduction squeeze due to lack of a ball, therefore provided instruction in alternative options including a pillow or rolled up towel/blanket. Manual therapy targeting increased muscle tension and ttp throughout L glutes and B paraspinals with instruction provided in seated version of prayer stretch to promote further muscle relaxation.    Personal Factors and Comorbidities  Age;Sex;Comorbidity 3+;Past/Current Experience;Fitness;Profession;Time since onset of injury/illness/exacerbation    Comorbidities  HA, depression, asthma, anxiety, current pregnancy    Examination-Activity Limitations  Sleep;Bed Mobility;Bend;Squat;Stairs;Caring for Others;Carry;Stand;Dressing;Transfers;Hygiene/Grooming;Lift;Locomotion Level;Reach Overhead    Examination-Participation Restrictions  Church;Cleaning;Shop;Community Activity;Driving;Interpersonal Relationship;Laundry;Meal Prep    Rehab Potential  Good    PT Frequency  1x / week    PT Duration  6 weeks    PT Treatment/Interventions  ADLs/Self Care Home Management;Cryotherapy;Moist Heat;Balance training;Therapeutic exercise;Therapeutic activities;Functional mobility training;Stair training;Gait training;Neuromuscular re-education;Patient/family education;Manual techniques;Taping;Energy conservation;Dry needling;Passive range of motion    PT Next Visit Plan  lumbar FOTO; core and hip strengthening, piriformis stretching    Consulted and Agree with Plan of Care  Patient       Patient will benefit from skilled therapeutic intervention in order to improve the following deficits and  impairments:  Abnormal gait, Decreased activity tolerance, Decreased strength, Pain, Difficulty walking, Increased muscle spasms, Improper body mechanics, Decreased range of motion, Impaired flexibility, Postural  dysfunction  Visit Diagnosis: Chronic bilateral low back pain with bilateral sciatica  Difficulty in walking, not elsewhere classified  Abnormal posture     Problem List Patient Active Problem List   Diagnosis Date Noted  . History of cesarean delivery 10/13/2019  . Gastroesophageal reflux during pregnancy, antepartum 10/13/2019  . PUPP (pruritic urticarial papules and plaques of pregnancy) 10/13/2019  . Supervision of other normal pregnancy, antepartum 08/19/2019  . Twin liveborn by cesarean 08/19/2019  . PTSD (post-traumatic stress disorder) 07/18/2017  . MDD (major depressive disorder), recurrent severe, without psychosis ([place]) 12/26/2013  . Sciatica 12/26/2013  . Attention or concentration deficit 02/08/2010  . Migraine 02/08/2010  . Allergic rhinitis 07/22/2008  . Exercise-induced asthma 07/22/2008    Percival Spanish, PT, MPT 10/28/2019, 11:00 AM  [facility] [address]  [place], [place], [address] Phone: [phone]   Fax:  [phone]  Name: Vicki Ford MRN: [id number] Date of Birth: 09/19/1990

## 2019-10-31 ENCOUNTER — Other Ambulatory Visit: Payer: Self-pay

## 2019-10-31 ENCOUNTER — Ambulatory Visit: Payer: BC Managed Care – PPO | Admitting: *Deleted

## 2019-10-31 ENCOUNTER — Ambulatory Visit: Payer: BC Managed Care – PPO | Attending: Obstetrics & Gynecology

## 2019-10-31 DIAGNOSIS — O09212 Supervision of pregnancy with history of pre-term labor, second trimester: Secondary | ICD-10-CM | POA: Diagnosis not present

## 2019-10-31 DIAGNOSIS — E669 Obesity, unspecified: Secondary | ICD-10-CM

## 2019-10-31 DIAGNOSIS — O99619 Diseases of the digestive system complicating pregnancy, unspecified trimester: Secondary | ICD-10-CM | POA: Insufficient documentation

## 2019-10-31 DIAGNOSIS — O99212 Obesity complicating pregnancy, second trimester: Secondary | ICD-10-CM | POA: Diagnosis not present

## 2019-10-31 DIAGNOSIS — K219 Gastro-esophageal reflux disease without esophagitis: Secondary | ICD-10-CM

## 2019-10-31 DIAGNOSIS — O2686 Pruritic urticarial papules and plaques of pregnancy (PUPPP): Secondary | ICD-10-CM | POA: Diagnosis present

## 2019-10-31 DIAGNOSIS — O34219 Maternal care for unspecified type scar from previous cesarean delivery: Secondary | ICD-10-CM

## 2019-10-31 DIAGNOSIS — Z98891 History of uterine scar from previous surgery: Secondary | ICD-10-CM | POA: Insufficient documentation

## 2019-10-31 DIAGNOSIS — Z3A19 19 weeks gestation of pregnancy: Secondary | ICD-10-CM

## 2019-10-31 DIAGNOSIS — Z363 Encounter for antenatal screening for malformations: Secondary | ICD-10-CM | POA: Diagnosis not present

## 2019-10-31 DIAGNOSIS — Z3480 Encounter for supervision of other normal pregnancy, unspecified trimester: Secondary | ICD-10-CM

## 2019-11-03 ENCOUNTER — Other Ambulatory Visit: Payer: Self-pay

## 2019-11-03 ENCOUNTER — Ambulatory Visit: Payer: BC Managed Care – PPO

## 2019-11-03 ENCOUNTER — Other Ambulatory Visit: Payer: Self-pay | Admitting: *Deleted

## 2019-11-03 DIAGNOSIS — M5442 Lumbago with sciatica, left side: Secondary | ICD-10-CM | POA: Diagnosis not present

## 2019-11-03 DIAGNOSIS — R293 Abnormal posture: Secondary | ICD-10-CM

## 2019-11-03 DIAGNOSIS — G8929 Other chronic pain: Secondary | ICD-10-CM

## 2019-11-03 DIAGNOSIS — O99212 Obesity complicating pregnancy, second trimester: Secondary | ICD-10-CM

## 2019-11-03 DIAGNOSIS — R262 Difficulty in walking, not elsewhere classified: Secondary | ICD-10-CM

## 2019-11-03 NOTE — Therapy (Signed)
[facility] [address]  [place], [place], [address] Phone: [phone]   Fax:  [phone]  Physical Therapy Treatment  Patient Details  Name: Vicki Ford MRN: [id number] Date of Birth: 06-Sep-1990 Referring Provider (PT): Lavonia Drafts, MD   Encounter Date: 11/03/2019  PT End of Session - 11/03/19 0827    Visit Number  3    Number of Visits  7    Date for PT Re-Evaluation  12/05/19    Authorization Type  BCBS    PT Start Time  0806    PT Stop Time  0845    PT Time Calculation (min)  39 min    Activity Tolerance  Patient tolerated treatment well    Behavior During Therapy  [facility] for tasks assessed/performed       Past Medical History:  Diagnosis Date  . Anxiety    takes zoloft, buspar, & atarax  . Asthma    sports induced asthma  . Depression    takes zoloft & buspar  . Headache   . Preterm labor     Past Surgical History:  Procedure Laterality Date  . CESAREAN SECTION    . GALLBLADDER SURGERY  01/15/2019  . HERNIA REPAIR      There were no vitals filed for this visit.  Subjective Assessment - 11/03/19 0808    Subjective  Pt reports she felt better for 1-2 days after her last PT session, barely feeling the pain for those 2 days. She feels most relief from seated hip ER stretching and seated child's pose 3 direction. Her sciatic pain is on the left today.    Patient Stated Goals  work on strength and flexibility    Currently in Pain?  Yes    Pain Score  5     Pain Location  Back    Pain Orientation  Left    Pain Descriptors / Indicators  Other (Comment);Nagging   bothersome   Pain Type  Acute pain                        [facility] Adult PT Treatment/Exercise - 11/03/19 0001      Exercises   Exercises  Lumbar      Lumbar Exercises: Stretches   Quadruped Mid Back Stretch  30 seconds;3 reps    Quadruped Mid Back Stretch Limitations  seated 3-way prayer stretch with green  Pball      Lumbar Exercises: Standing   Other Standing Lumbar Exercises  Wide partial squats at doorway corner, holding on for support, focusing on maintaining posterior tilt x 5        Lumbar Exercises: Seated   Other Seated Lumbar Exercises  Pelvic tilt 15 x 5" on green SB    Other Seated Lumbar Exercises  Hip ER/ABD seated clams with RTB 15x5", Hip ABD with ball 15 x 5"      Manual Therapy   Manual Therapy  Soft tissue mobilization    Manual therapy comments  R S/L with bolster b/t knees    Soft tissue mobilization  STM/XFM to L piriformis with IR and in neutral hip, L QL and glutes               PT Short Term Goals - 10/28/19 1049      PT SHORT TERM GOAL #1   Title  Patient to be independent with initial HEP.    Time  3  Period  Weeks    Status  On-going    Target Date  11/14/19        PT Long Term Goals - 10/28/19 1050      PT LONG TERM GOAL #1   Title  Patient to be independent with advanced HEP.    Time  6    Period  Weeks    Status  On-going    Target Date  12/05/19      PT LONG TERM GOAL #2   Title  Patient to demonstrate B LE strength >/=4+/5.    Time  6    Period  Weeks    Status  On-going    Target Date  12/05/19      PT LONG TERM GOAL #3   Title  Patient to demonstrate mild B piriformis & hip flexor tightness remaining.    Time  6    Period  Weeks    Status  On-going    Target Date  12/05/19      PT LONG TERM GOAL #4   Title  Patient to report understanding of edu on posture and body mechanics for improved comfort with ADLs.    Time  6    Period  Weeks    Status  On-going    Target Date  12/05/19      PT LONG TERM GOAL #5   Title  Patient to report 70% improvement in pain levels.    Time  6    Period  Weeks    Status  On-going    Target Date  12/05/19            Plan - 11/03/19 8675    Clinical Impression Statement  Pt tolerated tx well with focus on lumbopelvic alignment and rhythm. Faciliated neutral pelvis and  posterior pelvic tilt on SB in sitting, in standing statically, and with partial wide squats supported in doorway with pt reportig muscle fatigue like she "was working out" and relief of back pain. 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 She will continue to benefit from further skilled PT to continue to align pelvis and stabilize for pain relief throughout pregnancy.    Personal Factors and Comorbidities  Age;Sex;Comorbidity 3+;Past/Current Experience;Fitness;Profession;Time since onset of injury/illness/exacerbation    Comorbidities  HA, depression, asthma, anxiety, current pregnancy    Examination-Activity Limitations  Sleep;Bed Mobility;Bend;Squat;Stairs;Caring for Others;Carry;Stand;Dressing;Transfers;Hygiene/Grooming;Lift;Locomotion Level;Reach Overhead    Examination-Participation Restrictions  Church;Cleaning;Shop;Community Activity;Driving;Interpersonal Relationship;Laundry;Meal Prep    Rehab Potential  Good    PT Frequency  1x / week    PT Duration  6 weeks    PT Treatment/Interventions  ADLs/Self Care Home Management;Cryotherapy;Moist Heat;Balance training;Therapeutic exercise;Therapeutic activities;Functional mobility training;Stair training;Gait training;Neuromuscular re-education;Patient/family education;Manual techniques;Taping;Energy conservation;Dry needling;Passive range of motion    PT Next Visit Plan  lumbar FOTO; core and hip strengthening, piriformis stretching    Consulted and Agree with Plan of Care  Patient       Patient will benefit from skilled therapeutic intervention in order to improve the following deficits and impairments:  Abnormal gait, Decreased activity tolerance, Decreased strength, Pain, Difficulty walking, Increased muscle spasms, Improper body mechanics, Decreased range of motion, Impaired flexibility, Postural dysfunction  Visit Diagnosis: Chronic bilateral low  back pain with bilateral sciatica  Difficulty in walking, not elsewhere classified  Abnormal posture     Problem List Patient Active Problem List   Diagnosis Date  Noted  . History of cesarean delivery 10/13/2019  . Gastroesophageal reflux during pregnancy, antepartum 10/13/2019  . PUPP (pruritic urticarial papules and plaques of pregnancy) 10/13/2019  . Supervision of other normal pregnancy, antepartum 08/19/2019  . Twin liveborn by cesarean 08/19/2019  . PTSD (post-traumatic stress disorder) 07/18/2017  . MDD (major depressive disorder), recurrent severe, without psychosis (HCC) 12/26/2013  . Sciatica 12/26/2013  . Attention or concentration deficit 02/08/2010  . Migraine 02/08/2010  . Allergic rhinitis 07/22/2008  . Exercise-induced asthma 07/22/2008    Marcelline Mates, PT, DPT 11/03/2019, 8:50 AM  [facility] [address]  [address] [place], [place], [address] Phone: [phone]   Fax:  [phone]  Name: Vicki Ford MRN: [id number] Date of Birth: 29-Jan-1991

## 2019-11-10 ENCOUNTER — Ambulatory Visit: Payer: BC Managed Care – PPO | Admitting: Physical Therapy

## 2019-11-10 ENCOUNTER — Other Ambulatory Visit: Payer: Self-pay

## 2019-11-10 ENCOUNTER — Encounter: Payer: Self-pay | Admitting: Physical Therapy

## 2019-11-10 DIAGNOSIS — R293 Abnormal posture: Secondary | ICD-10-CM

## 2019-11-10 DIAGNOSIS — M5441 Lumbago with sciatica, right side: Secondary | ICD-10-CM

## 2019-11-10 DIAGNOSIS — R262 Difficulty in walking, not elsewhere classified: Secondary | ICD-10-CM

## 2019-11-10 DIAGNOSIS — M5442 Lumbago with sciatica, left side: Secondary | ICD-10-CM | POA: Diagnosis not present

## 2019-11-10 NOTE — Therapy (Addendum)
[facility] [address]  [place], [place], [address] Phone: [phone]   Fax:  [phone]  Physical Therapy Treatment  Patient Details  Name: Amandamarie Feggins MRN: [id number] Date of Birth: [birth year] Referring Provider (PT): Lavonia Drafts, MD   Encounter Date: 11/10/2019   PT End of Session - 11/10/19 0930    Visit Number 4    Number of Visits 7    Date for PT Re-Evaluation 12/05/19    Authorization Type BCBS    PT Start Time 0849    PT Stop Time 0929    PT Time Calculation (min) 40 min    Activity Tolerance Patient tolerated treatment well    Behavior During Therapy [facility] for tasks assessed/performed           Past Medical History:  Diagnosis Date  . Anxiety    takes zoloft, buspar, & atarax  . Asthma    sports induced asthma  . Depression    takes zoloft & buspar  . Headache   . Preterm labor     Past Surgical History:  Procedure Laterality Date  . CESAREAN SECTION    . GALLBLADDER SURGERY  01/15/2019  . HERNIA REPAIR      There were no vitals filed for this visit.   Subjective Assessment - 11/10/19 0849    Subjective Doing "a little bit better." HEP is going well. Getting good relief from [facility].    Pertinent History HA, depression, asthma, anxiety    Diagnostic tests none    Patient Stated Goals work on strength and flexibility    Currently in Pain? No/denies                             [facility] Adult PT Treatment/Exercise - 11/10/19 0001      Lumbar Exercises: Stretches   Piriformis Stretch Right;Left;30 seconds;1 rep    Piriformis Stretch Limitations seated KTOS    Figure 4 Stretch 30 seconds;Seated;With overpressure;1 rep    Figure 4 Stretch Limitations each LE; to tolerance      Lumbar Exercises: Aerobic   Recumbent Bike L1 x 6 min      Lumbar Exercises: Seated   Sit to Stand 5 reps   2x5 with red TB above knees   Other Seated Lumbar Exercises Pelvic tilt  10 x 5"     Other Seated Lumbar Exercises B hip IR with yellow loop around ankles and ball b/w knees 2x10      Lumbar Exercises: Quadruped   Madcat/Old Horse 10 reps    Madcat/Old Horse Limitations cues for rhythmic breathing    Other Quadruped Lumbar Exercises child's pose 5x10" with B UEs elevated on 2 pillows    Other Quadruped Lumbar Exercises tail wags x10 to tolerance   more discomfort to L     Manual Therapy   Manual Therapy Soft tissue mobilization    Manual therapy comments R S/L with bolster b/t knees    Soft tissue mobilization STM to L piriformis, proximal glutes, lumbar paraspinals, QL- very TTP over piriformis   good relief   Myofascial Release manual TPR to L piriformis                    PT Short Term Goals - 11/10/19 0932      PT SHORT TERM GOAL #1   Title Patient to be independent with initial HEP.  Time 3    Period Weeks    Status Achieved    Target Date 11/14/19             PT Long Term Goals - 10/28/19 1050      PT LONG TERM GOAL #1   Title Patient to be independent with advanced HEP.    Time 6    Period Weeks    Status On-going    Target Date 12/05/19      PT LONG TERM GOAL #2   Title Patient to demonstrate B LE strength >/=4+/5.    Time 6    Period Weeks    Status On-going    Target Date 12/05/19      PT LONG TERM GOAL #3   Title Patient to demonstrate mild B piriformis & hip flexor tightness remaining.    Time 6    Period Weeks    Status On-going    Target Date 12/05/19      PT LONG TERM GOAL #4   Title Patient to report understanding of edu on posture and body mechanics for improved comfort with ADLs.    Time 6    Period Weeks    Status On-going    Target Date 12/05/19      PT LONG TERM GOAL #5   Title Patient to report 70% improvement in pain levels.    Time 6    Period Weeks    Status On-going    Target Date 12/05/19                 Plan - 11/10/19 0931    Clinical Impression Statement Patient  reporting good relief from MT from previous sessions. No new complaints today. Worked on LE stretching and gentle lumbopelvic ROM today. Cueing provided to encourage posterior tilt upon standing from banded STS, as patient with obvious tendency to over-emphasize lumbar lordotic positioning in standing. Also notes most pain still occurring in standing, thus educated patient on postural modifications to avoid lordotic posture. Initiated banded hip IR with limited ROM but good tolerance. Patient performed quadruped lumbopelvic ROM and stretching without considerable pain. Ended session with STM and TPR to L buttock and posterior chain musculature, with most tenderness and soft tissue restriction remaining in piriformis. Patient noted relief and without complaints at end of session.    Personal Factors and Comorbidities Age;Sex;Comorbidity 3+;Past/Current Experience;Fitness;Profession;Time since onset of injury/illness/exacerbation    Comorbidities HA, depression, asthma, anxiety, current pregnancy    Examination-Activity Limitations Sleep;Bed Mobility;Bend;Squat;Stairs;Caring for Others;Carry;Stand;Dressing;Transfers;Hygiene/Grooming;Lift;Locomotion Level;Reach Overhead    Examination-Participation Restrictions Church;Cleaning;Shop;Community Activity;Driving;Interpersonal Relationship;Laundry;Meal Prep    Rehab Potential Good    PT Frequency 1x / week    PT Duration 6 weeks    PT Treatment/Interventions ADLs/Self Care Home Management;Cryotherapy;Moist Heat;Balance training;Therapeutic exercise;Therapeutic activities;Functional mobility training;Stair training;Gait training;Neuromuscular re-education;Patient/family education;Manual techniques;Taping;Energy conservation;Dry needling;Passive range of motion    PT Next Visit Plan core and hip strengthening, piriformis stretching    Consulted and Agree with Plan of Care Patient           Patient will benefit from skilled therapeutic intervention in order to  improve the following deficits and impairments:  Abnormal gait, Decreased activity tolerance, Decreased strength, Pain, Difficulty walking, Increased muscle spasms, Improper body mechanics, Decreased range of motion, Impaired flexibility, Postural dysfunction  Visit Diagnosis: Chronic bilateral low back pain with bilateral sciatica  Difficulty in walking, not elsewhere classified  Abnormal posture     Problem List Patient Active Problem List   Diagnosis Date  Noted  . History of cesarean delivery 10/13/2019  . Gastroesophageal reflux during pregnancy, antepartum 10/13/2019  . PUPP (pruritic urticarial papules and plaques of pregnancy) 10/13/2019  . Supervision of other normal pregnancy, antepartum 08/19/2019  . Twin liveborn by cesarean 08/19/2019  . PTSD (post-traumatic stress disorder) 07/18/2017  . MDD (major depressive disorder), recurrent severe, without psychosis ([place]) 12/26/2013  . Sciatica 12/26/2013  . Attention or concentration deficit 02/08/2010  . Migraine 02/08/2010  . Allergic rhinitis 07/22/2008  . Exercise-induced asthma 07/22/2008      Janene Harvey, PT, DPT 11/10/19 10:21 AM   [facility] [address]  [place], [place], [address] Phone: [phone]   Fax:  [phone]  Name: Earle Burson MRN: [id number] Date of Birth: Sep 24, 1990   PHYSICAL THERAPY DISCHARGE SUMMARY  Visits from Start of Care: 5  Current functional level related to goals / functional outcomes: Unable to assess; patient did not return   Remaining deficits: Unable to assess   Education / Equipment: HEP  Plan: Patient agrees to discharge.  Patient goals were not met. Patient is being discharged due to not returning since the last visit.  ?????     Janene Harvey, PT, DPT 01/01/20 2:20 PM

## 2019-11-13 ENCOUNTER — Ambulatory Visit (INDEPENDENT_AMBULATORY_CARE_PROVIDER_SITE_OTHER): Payer: BC Managed Care – PPO | Admitting: Family Medicine

## 2019-11-13 ENCOUNTER — Other Ambulatory Visit: Payer: Self-pay

## 2019-11-13 VITALS — BP 101/65 | HR 99 | Wt 193.0 lb

## 2019-11-13 DIAGNOSIS — O99342 Other mental disorders complicating pregnancy, second trimester: Secondary | ICD-10-CM

## 2019-11-13 DIAGNOSIS — F332 Major depressive disorder, recurrent severe without psychotic features: Secondary | ICD-10-CM

## 2019-11-13 DIAGNOSIS — F4310 Post-traumatic stress disorder, unspecified: Secondary | ICD-10-CM

## 2019-11-13 DIAGNOSIS — Z3480 Encounter for supervision of other normal pregnancy, unspecified trimester: Secondary | ICD-10-CM

## 2019-11-13 DIAGNOSIS — O99619 Diseases of the digestive system complicating pregnancy, unspecified trimester: Secondary | ICD-10-CM

## 2019-11-13 DIAGNOSIS — Z3A20 20 weeks gestation of pregnancy: Secondary | ICD-10-CM

## 2019-11-13 DIAGNOSIS — Z98891 History of uterine scar from previous surgery: Secondary | ICD-10-CM

## 2019-11-13 DIAGNOSIS — K219 Gastro-esophageal reflux disease without esophagitis: Secondary | ICD-10-CM

## 2019-11-13 NOTE — Progress Notes (Signed)
   PRENATAL VISIT NOTE  Subjective:  Vicki Ford is a 29 y.o. [phone] at [redacted]w[redacted]d being seen today for ongoing prenatal care.  She is currently monitored for the following issues for this high-risk pregnancy and has Supervision of other normal pregnancy, antepartum; Allergic rhinitis; Attention or concentration deficit; Exercise-induced asthma; PTSD (post-traumatic stress disorder); MDD (major depressive disorder), recurrent severe, without psychosis (HCC); Migraine; Sciatica; Twin liveborn by cesarean; History of cesarean delivery; Gastroesophageal reflux during pregnancy, antepartum; and PUPP (pruritic urticarial papules and plaques of pregnancy) on their problem list.  Patient reports no complaints.  Contractions: Not present. Vag. Bleeding: None.  Movement: Present. Denies leaking of fluid.   The following portions of the patient's history were reviewed and updated as appropriate: allergies, current medications, past family history, past medical history, past social history, past surgical history and problem list.   Objective:   Vitals:   11/13/19 1034  BP: 101/65  Pulse: 99  Weight: 193 lb (87.5 kg)    Fetal Status: Fetal Heart Rate (bpm): 145 Fundal Height: 20 cm Movement: Present     General:  Alert, oriented and cooperative. Patient is in no acute distress.  Skin: Skin is warm and dry. No rash noted.   Cardiovascular: Normal heart rate noted  Respiratory: Normal respiratory effort, no problems with respiration noted  Abdomen: Soft, gravid, appropriate for gestational age.  Pain/Pressure: Present     Pelvic: Cervical exam deferred        Extremities: Normal range of motion.  Edema: None  Mental Status: Normal mood and affect. Normal behavior. Normal judgment and thought content.   Assessment and Plan:  Pregnancy: [id number] at [redacted]w[redacted]d  1. Supervision of other normal pregnancy, antepartum FHT and FH normal  2. History of cesarean delivery Desires tolac  3. PTSD (post-traumatic  stress disorder)  4. MDD (major depressive disorder), recurrent severe, without psychosis (HCC) On zoloft previously, nothing currently.  5. Gastroesophageal reflux during pregnancy, antepartum On prevacid.  6. PUPPS On cistaril  Preterm labor symptoms and general obstetric precautions including but not limited to vaginal bleeding, contractions, leaking of fluid and fetal movement were reviewed in detail with the patient. Please refer to After Visit Summary for other counseling recommendations.   No follow-ups on file.  Future Appointments  Date Time Provider Department Center  11/17/2019  8:00 AM Marcelline Mates, PT [facility]  11/24/2019  8:00 AM Maryruth Bun, PT [facility]  11/28/2019  3:45 PM [facility] NURSE [facility]  11/28/2019  3:45 PM [facility] US4 [facility]  12/02/2019  8:00 AM Maryruth Bun, PT [facility]  12/11/2019 10:00 AM Levie Heritage, DO [facility] None    Levie Heritage, DO

## 2019-11-17 ENCOUNTER — Ambulatory Visit: Payer: BC Managed Care – PPO

## 2019-11-24 ENCOUNTER — Ambulatory Visit: Payer: BC Managed Care – PPO | Admitting: Physical Therapy

## 2019-11-28 ENCOUNTER — Ambulatory Visit: Payer: BC Managed Care – PPO | Attending: Obstetrics and Gynecology

## 2019-11-28 ENCOUNTER — Other Ambulatory Visit: Payer: Self-pay

## 2019-11-28 ENCOUNTER — Ambulatory Visit: Payer: BC Managed Care – PPO | Admitting: *Deleted

## 2019-11-28 DIAGNOSIS — O2686 Pruritic urticarial papules and plaques of pregnancy (PUPPP): Secondary | ICD-10-CM

## 2019-11-28 DIAGNOSIS — O99212 Obesity complicating pregnancy, second trimester: Secondary | ICD-10-CM | POA: Insufficient documentation

## 2019-11-28 DIAGNOSIS — Z98891 History of uterine scar from previous surgery: Secondary | ICD-10-CM | POA: Diagnosis present

## 2019-11-28 DIAGNOSIS — Z3A23 23 weeks gestation of pregnancy: Secondary | ICD-10-CM

## 2019-11-28 DIAGNOSIS — Z363 Encounter for antenatal screening for malformations: Secondary | ICD-10-CM | POA: Diagnosis not present

## 2019-11-28 DIAGNOSIS — E669 Obesity, unspecified: Secondary | ICD-10-CM

## 2019-11-28 DIAGNOSIS — K219 Gastro-esophageal reflux disease without esophagitis: Secondary | ICD-10-CM | POA: Insufficient documentation

## 2019-11-28 DIAGNOSIS — O34219 Maternal care for unspecified type scar from previous cesarean delivery: Secondary | ICD-10-CM

## 2019-11-28 DIAGNOSIS — Z3480 Encounter for supervision of other normal pregnancy, unspecified trimester: Secondary | ICD-10-CM

## 2019-11-28 DIAGNOSIS — O99619 Diseases of the digestive system complicating pregnancy, unspecified trimester: Secondary | ICD-10-CM | POA: Insufficient documentation

## 2019-11-28 DIAGNOSIS — O09212 Supervision of pregnancy with history of pre-term labor, second trimester: Secondary | ICD-10-CM | POA: Diagnosis not present

## 2019-12-02 ENCOUNTER — Other Ambulatory Visit: Payer: Self-pay

## 2019-12-02 ENCOUNTER — Other Ambulatory Visit: Payer: Self-pay | Admitting: *Deleted

## 2019-12-02 ENCOUNTER — Ambulatory Visit: Payer: BC Managed Care – PPO | Attending: Obstetrics & Gynecology | Admitting: Physical Therapy

## 2019-12-02 DIAGNOSIS — O99210 Obesity complicating pregnancy, unspecified trimester: Secondary | ICD-10-CM

## 2019-12-11 ENCOUNTER — Ambulatory Visit (INDEPENDENT_AMBULATORY_CARE_PROVIDER_SITE_OTHER): Payer: BC Managed Care – PPO | Admitting: Family Medicine

## 2019-12-11 ENCOUNTER — Other Ambulatory Visit: Payer: Self-pay

## 2019-12-11 VITALS — BP 103/64 | HR 115 | Wt 194.0 lb

## 2019-12-11 DIAGNOSIS — O2686 Pruritic urticarial papules and plaques of pregnancy (PUPPP): Secondary | ICD-10-CM

## 2019-12-11 DIAGNOSIS — O34219 Maternal care for unspecified type scar from previous cesarean delivery: Secondary | ICD-10-CM

## 2019-12-11 DIAGNOSIS — Z3A24 24 weeks gestation of pregnancy: Secondary | ICD-10-CM

## 2019-12-11 DIAGNOSIS — Z3480 Encounter for supervision of other normal pregnancy, unspecified trimester: Secondary | ICD-10-CM

## 2019-12-11 DIAGNOSIS — K219 Gastro-esophageal reflux disease without esophagitis: Secondary | ICD-10-CM

## 2019-12-11 DIAGNOSIS — Z98891 History of uterine scar from previous surgery: Secondary | ICD-10-CM

## 2019-12-11 DIAGNOSIS — O99612 Diseases of the digestive system complicating pregnancy, second trimester: Secondary | ICD-10-CM

## 2019-12-11 NOTE — Progress Notes (Signed)
   PRENATAL VISIT NOTE  Subjective:  Vicki Ford is a 29 y.o. [phone] at [redacted]w[redacted]d being seen today for ongoing prenatal care.  She is currently monitored for the following issues for this high-risk pregnancy and has Supervision of other normal pregnancy, antepartum; Allergic rhinitis; Attention or concentration deficit; Exercise-induced asthma; PTSD (post-traumatic stress disorder); MDD (major depressive disorder), recurrent severe, without psychosis (HCC); Migraine; Sciatica; Twin liveborn by cesarean; History of cesarean delivery; Gastroesophageal reflux during pregnancy, antepartum; and PUPP (pruritic urticarial papules and plaques of pregnancy) on their problem list.  Patient reports no complaints.  Contractions: Not present. Vag. Bleeding: None.  Movement: Present. Denies leaking of fluid.   The following portions of the patient's history were reviewed and updated as appropriate: allergies, current medications, past family history, past medical history, past social history, past surgical history and problem list.   Objective:   Vitals:   12/11/19 1001  BP: 103/64  Pulse: (!) 115  Weight: 194 lb (88 kg)    Fetal Status: Fetal Heart Rate (bpm): 154 Fundal Height: 24 cm Movement: Present     General:  Alert, oriented and cooperative. Patient is in no acute distress.  Skin: Skin is warm and dry. No rash noted.   Cardiovascular: Normal heart rate noted  Respiratory: Normal respiratory effort, no problems with respiration noted  Abdomen: Soft, gravid, appropriate for gestational age.  Pain/Pressure: Present     Pelvic: Cervical exam deferred        Extremities: Normal range of motion.  Edema: None  Mental Status: Normal mood and affect. Normal behavior. Normal judgment and thought content.   Assessment and Plan:  Pregnancy: [id number] at [redacted]w[redacted]d  1. Supervision of other normal pregnancy, antepartum FHT and FH normal  2. History of cesarean delivery Desires TOLAC - consent signed  3.  PUPP (pruritic urticarial papules and plaques of pregnancy)  4. Gastroesophageal reflux during pregnancy, antepartum    Preterm labor symptoms and general obstetric precautions including but not limited to vaginal bleeding, contractions, leaking of fluid and fetal movement were reviewed in detail with the patient. Please refer to After Visit Summary for other counseling recommendations.   Return in about 4 weeks (around 01/08/2020) for OB f/u, 2 hr GTT, In Office.  Future Appointments  Date Time Provider Department Center  12/12/2019 10:15 AM Teague Docia Chuck [facility]  12/29/2019  3:30 PM [facility] NURSE [facility]  12/29/2019  3:45 PM [facility] US4 [facility]  01/12/2020  8:30 AM Levie Heritage, DO [facility] None    Levie Heritage, DO

## 2019-12-12 ENCOUNTER — Institutional Professional Consult (permissible substitution): Payer: BC Managed Care – PPO | Admitting: Physician Assistant

## 2019-12-29 ENCOUNTER — Ambulatory Visit: Payer: BC Managed Care – PPO | Admitting: *Deleted

## 2019-12-29 ENCOUNTER — Other Ambulatory Visit: Payer: Self-pay

## 2019-12-29 ENCOUNTER — Ambulatory Visit: Payer: BC Managed Care – PPO | Attending: Obstetrics

## 2019-12-29 DIAGNOSIS — Z3480 Encounter for supervision of other normal pregnancy, unspecified trimester: Secondary | ICD-10-CM | POA: Insufficient documentation

## 2019-12-29 DIAGNOSIS — O09212 Supervision of pregnancy with history of pre-term labor, second trimester: Secondary | ICD-10-CM

## 2019-12-29 DIAGNOSIS — Z3A27 27 weeks gestation of pregnancy: Secondary | ICD-10-CM

## 2019-12-29 DIAGNOSIS — Z98891 History of uterine scar from previous surgery: Secondary | ICD-10-CM | POA: Insufficient documentation

## 2019-12-29 DIAGNOSIS — O99619 Diseases of the digestive system complicating pregnancy, unspecified trimester: Secondary | ICD-10-CM | POA: Insufficient documentation

## 2019-12-29 DIAGNOSIS — O99212 Obesity complicating pregnancy, second trimester: Secondary | ICD-10-CM

## 2019-12-29 DIAGNOSIS — K219 Gastro-esophageal reflux disease without esophagitis: Secondary | ICD-10-CM | POA: Diagnosis present

## 2019-12-29 DIAGNOSIS — O34219 Maternal care for unspecified type scar from previous cesarean delivery: Secondary | ICD-10-CM

## 2019-12-29 DIAGNOSIS — O99210 Obesity complicating pregnancy, unspecified trimester: Secondary | ICD-10-CM | POA: Diagnosis present

## 2019-12-29 DIAGNOSIS — Z363 Encounter for antenatal screening for malformations: Secondary | ICD-10-CM

## 2019-12-29 DIAGNOSIS — O2686 Pruritic urticarial papules and plaques of pregnancy (PUPPP): Secondary | ICD-10-CM | POA: Diagnosis present

## 2019-12-29 DIAGNOSIS — E669 Obesity, unspecified: Secondary | ICD-10-CM

## 2020-01-12 ENCOUNTER — Ambulatory Visit (INDEPENDENT_AMBULATORY_CARE_PROVIDER_SITE_OTHER): Payer: BC Managed Care – PPO | Admitting: Family Medicine

## 2020-01-12 ENCOUNTER — Other Ambulatory Visit: Payer: Self-pay

## 2020-01-12 VITALS — BP 106/64 | HR 90 | Wt 194.0 lb

## 2020-01-12 DIAGNOSIS — Z3A29 29 weeks gestation of pregnancy: Secondary | ICD-10-CM

## 2020-01-12 DIAGNOSIS — K219 Gastro-esophageal reflux disease without esophagitis: Secondary | ICD-10-CM

## 2020-01-12 DIAGNOSIS — Z3482 Encounter for supervision of other normal pregnancy, second trimester: Secondary | ICD-10-CM

## 2020-01-12 DIAGNOSIS — O99619 Diseases of the digestive system complicating pregnancy, unspecified trimester: Secondary | ICD-10-CM

## 2020-01-12 DIAGNOSIS — Z98891 History of uterine scar from previous surgery: Secondary | ICD-10-CM

## 2020-01-12 NOTE — Progress Notes (Signed)
   PRENATAL VISIT NOTE  Subjective:  Vicki Ford is a 29 y.o. [phone] at [redacted]w[redacted]d being seen today for ongoing prenatal care.  She is currently monitored for the following issues for this low-risk pregnancy and has Supervision of other normal pregnancy, antepartum; Allergic rhinitis; Attention or concentration deficit; Exercise-induced asthma; PTSD (post-traumatic stress disorder); MDD (major depressive disorder), recurrent severe, without psychosis (HCC); Migraine; Sciatica; Twin liveborn by cesarean; History of cesarean delivery; Gastroesophageal reflux during pregnancy, antepartum; and PUPP (pruritic urticarial papules and plaques of pregnancy) on their problem list.  Patient reports no complaints.  Contractions: Not present. Vag. Bleeding: None.  Movement: Present. Denies leaking of fluid.   The following portions of the patient's history were reviewed and updated as appropriate: allergies, current medications, past family history, past medical history, past social history, past surgical history and problem list.   Objective:   Vitals:   01/12/20 0831  BP: 106/64  Pulse: 90  Weight: 194 lb (88 kg)    Fetal Status: Fetal Heart Rate (bpm): 145 Fundal Height: 28 cm Movement: Present     General:  Alert, oriented and cooperative. Patient is in no acute distress.  Skin: Skin is warm and dry. No rash noted.   Cardiovascular: Normal heart rate noted  Respiratory: Normal respiratory effort, no problems with respiration noted  Abdomen: Soft, gravid, appropriate for gestational age.  Pain/Pressure: Present     Pelvic: Cervical exam deferred        Extremities: Normal range of motion.  Edema: None  Mental Status: Normal mood and affect. Normal behavior. Normal judgment and thought content.   Assessment and Plan:  Pregnancy: [id number] at [redacted]w[redacted]d 1. [redacted] weeks gestation of pregnancy - Glucose Tolerance, 2 Hours w/1 Hour - RPR - HIV antibody (with reflex) - CBC  2. Encounter for supervision of  other normal pregnancy in second trimester FHT and FH normal - Glucose Tolerance, 2 Hours w/1 Hour - RPR - HIV antibody (with reflex) - CBC  3. History of cesarean delivery Desires TOLAC  4. Gastroesophageal reflux during pregnancy, antepartum Controlled with PPI   Preterm labor symptoms and general obstetric precautions including but not limited to vaginal bleeding, contractions, leaking of fluid and fetal movement were reviewed in detail with the patient. Please refer to After Visit Summary for other counseling recommendations.   Return in about 2 weeks (around 01/26/2020) for OB f/u.  No future appointments.  Levie Heritage, DO

## 2020-01-13 LAB — GLUCOSE TOLERANCE, 2 HOURS W/ 1HR
Glucose, 1 hour: 159 mg/dL (ref 65–179)
Glucose, 2 hour: 131 mg/dL (ref 65–152)
Glucose, Fasting: 84 mg/dL (ref 65–91)

## 2020-01-13 LAB — CBC
Hematocrit: 34.1 % (ref 34.0–46.6)
Hemoglobin: 11.5 g/dL (ref 11.1–15.9)
MCH: 28.5 pg (ref 26.6–33.0)
MCHC: 33.7 g/dL (ref 31.5–35.7)
MCV: 85 fL (ref 79–97)
Platelets: 207 10*3/uL (ref 150–450)
RBC: 4.03 x10E6/uL (ref 3.77–5.28)
RDW: 13.7 % (ref 11.7–15.4)
WBC: 8.5 10*3/uL (ref 3.4–10.8)

## 2020-01-13 LAB — HIV ANTIBODY (ROUTINE TESTING W REFLEX): HIV Screen 4th Generation wRfx: NONREACTIVE

## 2020-01-13 LAB — RPR: RPR Ser Ql: NONREACTIVE

## 2020-01-26 ENCOUNTER — Other Ambulatory Visit: Payer: Self-pay

## 2020-01-26 ENCOUNTER — Ambulatory Visit (INDEPENDENT_AMBULATORY_CARE_PROVIDER_SITE_OTHER): Payer: BC Managed Care – PPO | Admitting: Family Medicine

## 2020-01-26 VITALS — BP 90/66 | HR 93 | Wt 193.0 lb

## 2020-01-26 DIAGNOSIS — Z98891 History of uterine scar from previous surgery: Secondary | ICD-10-CM

## 2020-01-26 DIAGNOSIS — K219 Gastro-esophageal reflux disease without esophagitis: Secondary | ICD-10-CM

## 2020-01-26 DIAGNOSIS — F332 Major depressive disorder, recurrent severe without psychotic features: Secondary | ICD-10-CM

## 2020-01-26 DIAGNOSIS — O99619 Diseases of the digestive system complicating pregnancy, unspecified trimester: Secondary | ICD-10-CM

## 2020-01-26 DIAGNOSIS — Z3482 Encounter for supervision of other normal pregnancy, second trimester: Secondary | ICD-10-CM

## 2020-01-26 NOTE — Progress Notes (Signed)
   PRENATAL VISIT NOTE  Subjective:  Vicki Ford is a 29 y.o. [phone] at [redacted]w[redacted]d being seen today for ongoing prenatal care.  She is currently monitored for the following issues for this high-risk pregnancy and has Supervision of other normal pregnancy, antepartum; Allergic rhinitis; Attention or concentration deficit; Exercise-induced asthma; PTSD (post-traumatic stress disorder); MDD (major depressive disorder), recurrent severe, without psychosis (HCC); Migraine; Sciatica; Twin liveborn by cesarean; History of cesarean delivery; Gastroesophageal reflux during pregnancy, antepartum; and PUPP (pruritic urticarial papules and plaques of pregnancy) on their problem list.  Patient reports feeling exhausted.  Contractions: Not present. Vag. Bleeding: None.  Movement: Present. Denies leaking of fluid.   The following portions of the patient's history were reviewed and updated as appropriate: allergies, current medications, past family history, past medical history, past social history, past surgical history and problem list.   Objective:   Vitals:   01/26/20 1014  BP: 90/66  Pulse: 93  Weight: 193 lb (87.5 kg)    Fetal Status:   Fundal Height: 31 cm Movement: Present     General:  Alert, oriented and cooperative. Patient is in no acute distress.  Skin: Skin is warm and dry. No rash noted.   Cardiovascular: Normal heart rate noted  Respiratory: Normal respiratory effort, no problems with respiration noted  Abdomen: Soft, gravid, appropriate for gestational age.  Pain/Pressure: Present     Pelvic: Cervical exam deferred        Extremities: Normal range of motion.  Edema: None  Mental Status: Normal mood and affect. Normal behavior. Normal judgment and thought content.   Assessment and Plan:  Pregnancy: [id number] at [redacted]w[redacted]d 1. Encounter for supervision of other normal pregnancy in second trimester FHT and FH normal. Gets 12 weeks maternity leave plus FMLA. Contemplating starting maternity leave  prior to delivery.  2. History of cesarean delivery Desires TOLAC  3. Gastroesophageal reflux during pregnancy, antepartum  4. H/o MDD/PPD Discussed possibly starting SSRI prior to delivery - will think on it.  Preterm labor symptoms and general obstetric precautions including but not limited to vaginal bleeding, contractions, leaking of fluid and fetal movement were reviewed in detail with the patient. Please refer to After Visit Summary for other counseling recommendations.   Return in about 2 weeks (around 02/09/2020) for OB f/u, In Office.  No future appointments.  Levie Heritage, DO

## 2020-02-10 ENCOUNTER — Other Ambulatory Visit: Payer: Self-pay

## 2020-02-10 ENCOUNTER — Ambulatory Visit (INDEPENDENT_AMBULATORY_CARE_PROVIDER_SITE_OTHER): Payer: BC Managed Care – PPO | Admitting: Advanced Practice Midwife

## 2020-02-10 ENCOUNTER — Encounter: Payer: Self-pay | Admitting: Advanced Practice Midwife

## 2020-02-10 VITALS — BP 101/62 | HR 103 | Wt 196.0 lb

## 2020-02-10 DIAGNOSIS — M5431 Sciatica, right side: Secondary | ICD-10-CM

## 2020-02-10 DIAGNOSIS — O2686 Pruritic urticarial papules and plaques of pregnancy (PUPPP): Secondary | ICD-10-CM

## 2020-02-10 DIAGNOSIS — O99619 Diseases of the digestive system complicating pregnancy, unspecified trimester: Secondary | ICD-10-CM

## 2020-02-10 DIAGNOSIS — R1030 Lower abdominal pain, unspecified: Secondary | ICD-10-CM

## 2020-02-10 DIAGNOSIS — M5432 Sciatica, left side: Secondary | ICD-10-CM

## 2020-02-10 DIAGNOSIS — Z98891 History of uterine scar from previous surgery: Secondary | ICD-10-CM

## 2020-02-10 DIAGNOSIS — K219 Gastro-esophageal reflux disease without esophagitis: Secondary | ICD-10-CM

## 2020-02-10 DIAGNOSIS — F332 Major depressive disorder, recurrent severe without psychotic features: Secondary | ICD-10-CM

## 2020-02-10 DIAGNOSIS — Z3A33 33 weeks gestation of pregnancy: Secondary | ICD-10-CM

## 2020-02-10 MED ORDER — CYCLOBENZAPRINE HCL 5 MG PO TABS: 5.0000 mg | ORAL_TABLET | Freq: Three times a day (TID) | ORAL | 0 refills | Status: DC | PRN

## 2020-02-10 NOTE — Patient Instructions (Signed)
Third Trimester of Pregnancy  The third trimester is from week 28 through week 40 (months 7 through 9). This trimester is when your unborn baby (fetus) is growing very fast. At the end of the ninth month, the unborn baby is about 20 inches in length. It weighs about 6-10 pounds. Follow these instructions at home: Medicines  Take over-the-counter and prescription medicines only as told by your doctor. Some medicines are safe and some medicines are not safe during pregnancy.  Take a prenatal vitamin that contains at least 600 micrograms (mcg) of folic acid.  If you have trouble pooping (constipation), take medicine that will make your stool soft (stool softener) if your doctor approves. Eating and drinking   Eat regular, healthy meals.  Avoid raw meat and uncooked cheese.  If you get low calcium from the food you eat, talk to your doctor about taking a daily calcium supplement.  Eat four or five small meals rather than three large meals a day.  Avoid foods that are high in fat and sugars, such as fried and sweet foods.  To prevent constipation: ? Eat foods that are high in fiber, like fresh fruits and vegetables, whole grains, and beans. ? Drink enough fluids to keep your pee (urine) clear or pale yellow. Activity  Exercise only as told by your doctor. Stop exercising if you start to have cramps.  Avoid heavy lifting, wear low heels, and sit up straight.  Do not exercise if it is too hot, too humid, or if you are in a place of great height (high altitude).  You may continue to have sex unless your doctor tells you not to. Relieving pain and discomfort  Wear a good support bra if your breasts are tender.  Take frequent breaks and rest with your legs raised if you have leg cramps or low back pain.  Take warm water baths (sitz baths) to soothe pain or discomfort caused by hemorrhoids. Use hemorrhoid cream if your doctor approves.  If you develop puffy, bulging veins (varicose  veins) in your legs: ? Wear support hose or compression stockings as told by your doctor. ? Raise (elevate) your feet for 15 minutes, 3-4 times a day. ? Limit salt in your food. Safety  Wear your seat belt when driving.  Make a list of emergency phone numbers, including numbers for family, friends, the hospital, and police and fire departments. Preparing for your baby's arrival To prepare for the arrival of your baby:  Take prenatal classes.  Practice driving to the hospital.  Visit the hospital and tour the maternity area.  Talk to your work about taking leave once the baby comes.  Pack your hospital bag.  Prepare the baby's room.  Go to your doctor visits.  Buy a rear-facing car seat. Learn how to install it in your car. General instructions  Do not use hot tubs, steam rooms, or saunas.  Do not use any products that contain nicotine or tobacco, such as cigarettes and e-cigarettes. If you need help quitting, ask your doctor.  Do not drink alcohol.  Do not douche or use tampons or scented sanitary pads.  Do not cross your legs for long periods of time.  Do not travel for long distances unless you must. Only do so if your doctor says it is okay.  Visit your dentist if you have not gone during your pregnancy. Use a soft toothbrush to brush your teeth. Be gentle when you floss.  Avoid cat litter boxes and soil   used by cats. These carry germs that can cause birth defects in the baby and can cause a loss of your baby (miscarriage) or stillbirth.  Keep all your prenatal visits as told by your doctor. This is important. Contact a doctor if:  You are not sure if you are in labor or if your water has broken.  You are dizzy.  You have mild cramps or pressure in your lower belly.  You have a nagging pain in your belly area.  You continue to feel sick to your stomach, you throw up, or you have watery poop.  You have bad smelling fluid coming from your vagina.  You have  pain when you pee. Get help right away if:  You have a fever.  You are leaking fluid from your vagina.  You are spotting or bleeding from your vagina.  You have severe belly cramps or pain.  You lose or gain weight quickly.  You have trouble catching your breath and have chest pain.  You notice sudden or extreme puffiness (swelling) of your face, hands, ankles, feet, or legs.  You have not felt the baby move in over an hour.  You have severe headaches that do not go away with medicine.  You have trouble seeing.  You are leaking, or you are having a gush of fluid, from your vagina before you are 37 weeks.  You have regular belly spasms (contractions) before you are 37 weeks. Summary  The third trimester is from week 28 through week 40 (months 7 through 9). This time is when your unborn baby is growing very fast.  Follow your doctor's advice about medicine, food, and activity.  Get ready for the arrival of your baby by taking prenatal classes, getting all the baby items ready, preparing the baby's room, and visiting your doctor to be checked.  Get help right away if you are bleeding from your vagina, or you have chest pain and trouble catching your breath, or if you have not felt your baby move in over an hour. This information is not intended to replace advice given to you by your health care provider. Make sure you discuss any questions you have with your health care provider. Document Revised: 09/05/2018 Document Reviewed: 06/20/2016 Elsevier Patient Education  2020 [employer] Adductor Muscle Strain  An adductor muscle strain, also called a groin strain or pull, is an injury to the muscles or tendons on the upper, inner part of the thigh. These muscles are called the adductor muscles or groin muscles. They are responsible for moving the legs across the body or pulling the legs together. A muscle strain occurs when a muscle is overstretched and some muscle fibers are  torn. An adductor muscle strain can range from mild to severe, depending on how many muscle fibers are affected and whether the muscle fibers are partially or completely torn. What are the causes? Adductor muscle strains usually occur during exercise or while participating in sports. The injury often happens when a sudden, violent force is placed on a muscle, stretching the muscle too far. A strain is more likely to happen when your muscles are not warmed up or if you are not properly conditioned. This injury may be caused by:  Stretching the adductor muscles too far or too suddenly, often during side-to-side motion with a sudden change in direction.  Putting repeated stress on the adductor muscles over a long period of time.  Performing vigorous activity without properly stretching the adductor muscles beforehand.  What are the signs or symptoms? Symptoms of this condition include:  Pain and tenderness in the groin area. This begins as sharp pain and persists as a dull ache.  A popping or snapping feeling when the injury occurs (for severe strains).  Swelling or bruising.  Muscle spasms.  Weakness in the leg.  Stiffness in the groin area with decreased ability to move the affected muscles. How is this diagnosed? This condition may be diagnosed based on:  Your symptoms and a description of how the injury occurred.  A physical exam.  Imaging tests, such as: ? X-rays. These are sometimes needed to rule out a broken bone or cartilage problems. ? An ultrasound, CT scan, or MRI. These may be done if your health care provider suspects a complete muscle tear or needs to check for other injuries. How is this treated? An adductor strain will often heal on its own. If needed, this condition may be treated with:  PRICE therapy. PRICE stands for protection of the injured area, rest, ice, pressure (compression), and elevation.  Medicines to help manage pain and swelling (anti-inflammatory  medicines).  Crutches. You may be directed to use these for the first few days to minimize your pain. Depending on the severity of the muscle strain, recovery time may vary from a few weeks to several months. Severe injuries often require 4-6 weeks for recovery. In those cases, complete healing can take 4-5 months. Follow these instructions at home: PRICE Therapy   Protect the muscle from being injured again.  Rest. Do not use the strained muscle if it causes pain.  If directed, put ice on the injured area: ? Put ice in a plastic bag. ? Place a towel between your skin and the bag. ? Leave the ice on for 20 minutes, 2-3 times a day. Do this for the first 2 days after the injury.  Apply compression by wrapping the injured area with an elastic bandage as told by your health care provider.  Raise (elevate) the injured area above the level of your heart while you are sitting or lying down. General instructions  Take over-the-counter and prescription medicines only as told by your health care provider.  Walk, stretch, and do exercises as told by your health care provider. Only do these activities if you can do so without any pain.  Follow your treatment plan as told by your health care provider. This may include: ? Physical therapy. ? Massage. ? Local electrical stimulation (transcutaneous electrical nerve stimulation, TENS). How is this prevented?  Warm up and stretch before being active.  Cool down and stretch after being active.  Give your body time to rest between periods of activity.  Make sure to use equipment that fits you.  Be safe and responsible while being active to avoid slips and falls.  Maintain physical fitness, including: ? Proper conditioning in the adductor muscles. ? Overall strength, flexibility, and endurance. Contact a health care provider if:  You have increased pain or swelling in the affected area.  Your symptoms are not improving or they are  getting worse. Summary  An adductor muscle strain, also called a groin strain or pull, is an injury to the muscles or tendons on the upper, inner part of the thigh.  A muscle strain occurs when a muscle is overstretched and some muscle fibers are torn.  Depending on the severity of the muscle strain, recovery time may vary from a few weeks to several months. This information is not intended  to replace advice given to you by your health care provider. Make sure you discuss any questions you have with your health care provider. Document Revised: 09/03/2018 Document Reviewed: 10/15/2017 Elsevier Patient Education  2020 [employer].

## 2020-02-10 NOTE — Progress Notes (Signed)
   PRENATAL VISIT NOTE  Subjective:  Vicki Ford is a 29 y.o. [phone] at [redacted]w[redacted]d being seen today for ongoing prenatal care.  She is currently monitored for the following issues for this low-risk pregnancy and has Supervision of other normal pregnancy, antepartum; Allergic rhinitis; Attention or concentration deficit; Exercise-induced asthma; PTSD (post-traumatic stress disorder); MDD (major depressive disorder), recurrent severe, without psychosis (HCC); Migraine; Sciatica; Twin liveborn by cesarean; History of cesarean delivery; Gastroesophageal reflux during pregnancy, antepartum; and PUPP (pruritic urticarial papules and plaques of pregnancy) on their problem list.  Patient reports severe bilateral groin pain, nothing makes it better.  Contractions: Irritability. Vag. Bleeding: None.  Movement: Present. Denies leaking of fluid.   The following portions of the patient's history were reviewed and updated as appropriate: allergies, current medications, past family history, past medical history, past social history, past surgical history and problem list.   Objective:   Vitals:   02/10/20 1001  BP: 101/62  Pulse: (!) 103  Weight: 196 lb (88.9 kg)    Fetal Status: Fetal Heart Rate (bpm): 150   Movement: Present     General:  Alert, oriented and cooperative. Patient is in no acute distress.  Skin: Skin is warm and dry. No rash noted.   Cardiovascular: Normal heart rate noted  Respiratory: Normal respiratory effort, no problems with respiration noted  Abdomen: Soft, gravid, appropriate for gestational age.  Pain/Pressure: Present     Pelvic: Cervical exam deferred        Extremities: Normal range of motion.  Edema: Trace  Mental Status: Normal mood and affect. Normal behavior. Normal judgment and thought content.   Assessment and Plan:  Pregnancy: [id number] at [redacted]w[redacted]d 1. [redacted] weeks gestation of pregnancy     Discussed water intake   Suggest trying diluted lemonade. State "water makes baby  sick".  2. History of cesarean delivery     Plans TOLAC  3. Gastroesophageal reflux during pregnancy, antepartum     On PPI  4. PUPP (pruritic urticarial papules and plaques of pregnancy)     On Vistaril  5. Bilateral sciatica     Better so not going to PT now  6. MDD (major depressive disorder), recurrent severe, without psychosis (HCC)       7. Inguinal pain, unspecified laterality     Discussed reconsulting PT for this problem     Doing stretches, ice, warm water, nothing helps, even at night.       Will refer to Dr Adrian Blackwater for assessment and treatment  - Ambulatory referral to Physical Therapy - cyclobenzaprine (FLEXERIL) 5 MG tablet; Take 1 tablet (5 mg total) by mouth 3 (three) times daily as needed for muscle spasms.  Dispense: 30 tablet; Refill: 0  Preterm labor symptoms and general obstetric precautions including but not limited to vaginal bleeding, contractions, leaking of fluid and fetal movement were reviewed in detail with the patient. Please refer to After Visit Summary for other counseling recommendations.   Return in about 2 weeks (around 02/24/2020) for [facility].  Future Appointments  Date Time Provider Department Center  02/23/2020  8:15 AM Levie Heritage, DO [facility] None    Wynelle Bourgeois, CNM

## 2020-02-23 ENCOUNTER — Ambulatory Visit (INDEPENDENT_AMBULATORY_CARE_PROVIDER_SITE_OTHER): Payer: BC Managed Care – PPO | Admitting: Family Medicine

## 2020-02-23 ENCOUNTER — Other Ambulatory Visit: Payer: Self-pay

## 2020-02-23 VITALS — BP 97/64 | HR 98 | Wt 196.0 lb

## 2020-02-23 DIAGNOSIS — Z3A35 35 weeks gestation of pregnancy: Secondary | ICD-10-CM

## 2020-02-23 DIAGNOSIS — K219 Gastro-esophageal reflux disease without esophagitis: Secondary | ICD-10-CM

## 2020-02-23 DIAGNOSIS — Z3482 Encounter for supervision of other normal pregnancy, second trimester: Secondary | ICD-10-CM

## 2020-02-23 DIAGNOSIS — Z98891 History of uterine scar from previous surgery: Secondary | ICD-10-CM

## 2020-02-23 DIAGNOSIS — O2686 Pruritic urticarial papules and plaques of pregnancy (PUPPP): Secondary | ICD-10-CM

## 2020-02-23 DIAGNOSIS — O99619 Diseases of the digestive system complicating pregnancy, unspecified trimester: Secondary | ICD-10-CM

## 2020-02-23 NOTE — Progress Notes (Signed)
   PRENATAL VISIT NOTE  Subjective:  Vicki Ford is a 29 y.o. [phone] at [redacted]w[redacted]d being seen today for ongoing prenatal care.  She is currently monitored for the following issues for this high-risk pregnancy and has Supervision of other normal pregnancy, antepartum; Allergic rhinitis; Attention or concentration deficit; Exercise-induced asthma; PTSD (post-traumatic stress disorder); MDD (major depressive disorder), recurrent severe, without psychosis (HCC); Migraine; Sciatica; Twin liveborn by cesarean; History of cesarean delivery; Gastroesophageal reflux during pregnancy, antepartum; and PUPP (pruritic urticarial papules and plaques of pregnancy) on their problem list.  Patient reports pelvic pain.  Contractions: Not present. Vag. Bleeding: None.  Movement: Present. Denies leaking of fluid.   The following portions of the patient's history were reviewed and updated as appropriate: allergies, current medications, past family history, past medical history, past social history, past surgical history and problem list.   Objective:   Vitals:   02/23/20 0825  BP: 97/64  Pulse: 98  Weight: 196 lb (88.9 kg)    Fetal Status: Fetal Heart Rate (bpm): 145 Fundal Height: 35 cm Movement: Present  Presentation: Vertex  General:  Alert, oriented and cooperative. Patient is in no acute distress.  Skin: Skin is warm and dry. No rash noted.   Cardiovascular: Normal heart rate noted  Respiratory: Normal respiratory effort, no problems with respiration noted  Abdomen: Soft, gravid, appropriate for gestational age.  Pain/Pressure: Present     Pelvic: Cervical exam deferred        Extremities: Normal range of motion.  Edema: Trace  Mental Status: Normal mood and affect. Normal behavior. Normal judgment and thought content.   Assessment and Plan:  Pregnancy: [id number] at [redacted]w[redacted]d 1. [redacted] weeks gestation of pregnancy 2. Encounter for supervision of other normal pregnancy in second trimester FHT and FH normal  3.  History of cesarean delivery Desires TOLAC  4. Gastroesophageal reflux during pregnancy, antepartum On acid reducer  5. PUPP (pruritic urticarial papules and plaques of pregnancy)  Preterm labor symptoms and general obstetric precautions including but not limited to vaginal bleeding, contractions, leaking of fluid and fetal movement were reviewed in detail with the patient. Please refer to After Visit Summary for other counseling recommendations.   Return in about 1 week (around 03/01/2020) for OB f/u, GBS, In Office.  Future Appointments  Date Time Provider Department Center  03/02/2020  8:40 AM Aviva Signs, CNM [facility] None  03/08/2020  8:45 AM Maryruth Bun, PT [facility]    Levie Heritage, DO

## 2020-03-01 ENCOUNTER — Ambulatory Visit (INDEPENDENT_AMBULATORY_CARE_PROVIDER_SITE_OTHER): Payer: BC Managed Care – PPO | Admitting: Obstetrics & Gynecology

## 2020-03-01 ENCOUNTER — Other Ambulatory Visit (HOSPITAL_COMMUNITY)
Admission: RE | Admit: 2020-03-01 | Discharge: 2020-03-01 | Disposition: A | Discharge status: Home / Self Care | Payer: BC Managed Care – PPO | Source: Ambulatory Visit | Attending: Obstetrics & Gynecology | Admitting: Obstetrics & Gynecology

## 2020-03-01 ENCOUNTER — Other Ambulatory Visit: Payer: Self-pay

## 2020-03-01 VITALS — BP 101/63 | HR 103 | Wt 197.0 lb

## 2020-03-01 DIAGNOSIS — Z3480 Encounter for supervision of other normal pregnancy, unspecified trimester: Secondary | ICD-10-CM | POA: Insufficient documentation

## 2020-03-01 DIAGNOSIS — O2686 Pruritic urticarial papules and plaques of pregnancy (PUPPP): Secondary | ICD-10-CM

## 2020-03-01 DIAGNOSIS — K219 Gastro-esophageal reflux disease without esophagitis: Secondary | ICD-10-CM

## 2020-03-01 DIAGNOSIS — Z3A36 36 weeks gestation of pregnancy: Secondary | ICD-10-CM | POA: Insufficient documentation

## 2020-03-01 DIAGNOSIS — F332 Major depressive disorder, recurrent severe without psychotic features: Secondary | ICD-10-CM

## 2020-03-01 DIAGNOSIS — O99619 Diseases of the digestive system complicating pregnancy, unspecified trimester: Secondary | ICD-10-CM

## 2020-03-01 DIAGNOSIS — Z98891 History of uterine scar from previous surgery: Secondary | ICD-10-CM

## 2020-03-01 NOTE — Progress Notes (Signed)
   PRENATAL VISIT NOTE  Subjective:  Vicki Ford is a 29 y.o. [phone] at [redacted]w[redacted]d being seen today for ongoing prenatal care.  She is currently monitored for the following issues for this low-risk pregnancy and has Supervision of other normal pregnancy, antepartum; Allergic rhinitis; Attention or concentration deficit; Exercise-induced asthma; PTSD (post-traumatic stress disorder); MDD (major depressive disorder), recurrent severe, without psychosis (HCC); Migraine; Sciatica; Twin liveborn by cesarean; History of cesarean delivery; Gastroesophageal reflux during pregnancy, antepartum; and PUPP (pruritic urticarial papules and plaques of pregnancy) on their problem list.  Patient reports no complaints.  Contractions: Not present. Vag. Bleeding: None.  Movement: Present. Denies leaking of fluid.   The following portions of the patient's history were reviewed and updated as appropriate: allergies, current medications, past family history, past medical history, past social history, past surgical history and problem list.   Objective:   Vitals:   03/01/20 0846  BP: 101/63  Pulse: (!) 103  Weight: 197 lb (89.4 kg)    Fetal Status: Fetal Heart Rate (bpm): 138   Movement: Present     General:  Alert, oriented and cooperative. Patient is in no acute distress.  Skin: Skin is warm and dry. No rash noted.   Cardiovascular: Normal heart rate noted  Respiratory: Normal respiratory effort, no problems with respiration noted  Abdomen: Soft, gravid, appropriate for gestational age.  Pain/Pressure: Present     Pelvic: Cervical exam performed in the presence of a chaperone        Extremities: Normal range of motion.  Edema: None  Mental Status: Normal mood and affect. Normal behavior. Normal judgment and thought content.   Assessment and Plan:  Pregnancy: [id number] at [redacted]w[redacted]d 1. [redacted] weeks gestation of pregnancy  2. Supervision of other normal pregnancy, antepartum Reviewed labor precautions.  Send 36 week  cx  3. History of cesarean delivery Desired TOLAC. Papers signed. AP  4. Gastroesophageal reflux during pregnancy, antepartum Relieved on meds wants to increase meds as sx retuning.    5. 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 MDD (major depressive disorder), recurrent severe, without psychosis (HCC) stable  Preterm labor symptoms and general obstetric precautions including but not limited to vaginal bleeding, contractions, leaking of fluid and fetal movement were reviewed in detail with the patient. Please refer to After Visit Summary for other counseling recommendations.   Return in about 1 week (around 03/08/2020).  Future Appointments  Date Time Provider Department Center  03/08/2020  8:45 AM Maryruth Bun, PT [facility]    Willodean Rosenthal, MD

## 2020-03-01 NOTE — Patient Instructions (Signed)
Natural Childbirth Natural childbirth is when labor and delivery progresses naturally with minimal medical assistance or medicines. Natural childbirth may be an option if you have a low risk pregnancy. With the help of a birthing professional such as a midwife, doula, or other health care provider, you may be able to use relaxation techniques and controlled breathing to manage pain during labor. Many women choose natural childbirth because it makes them feel more in control and in touch with the experience of giving birth. Some women also choose natural childbirth because they are concerned that medicines may affect them and their babies. What types of natural childbirth techniques are available? There are two types of natural childbirth techniques, which are taught in classes:  The Lamaze method. In these classes, parents learn that having a baby is normal, healthy, and natural. Mothers are taught to take a neutral position regarding pain medicine and numbing medicines, and to make an informed decision about using these medicines if the time comes.  The Bradley method, also called husband-coached birth. In these classes, the father or partner learns to be the birth coach. The mother is encouraged to exercise and eat a balanced, nutritious diet. Both parents also learn relaxation and deep breathing exercises and are taught how to prepare for emergency situations. What are some natural pain and relaxation techniques? If you are considering a natural childbirth, you should explore your options for managing pain and discomfort during your labor and delivery. Some natural pain and relaxation techniques include:  Meditation.  Yoga.  Hypnosis.  Acupuncture.  Massage.  Changing positions, such as by walking, rocking, showering, or leaning on birth balls.  Lying in warm water or a whirlpool bath.  Finding an activity that keeps your mind off the labor pain.  Listening to soft music.  Focusing  on a particular object (visual imagery). How can I prepare for a natural birth?  Talk with your spouse or partner about your goals for having a natural childbirth.  Decide if you will have your baby in the hospital, at a birthing center, or at home.  Have your spouse or partner attend the natural childbirth technique classes with you.  Decide which type of health care provider you would like to assist you with your delivery.  If you have other children, make plans to have someone take care of them when you go to the hospital or birthing center.  Know the distance and the time it takes to go to the hospital or birthing center. Practice going there and time it to be sure.  Have a bag packed with a nightgown, bathrobe, and toiletries. Be ready to take it with you when you go into labor.  Keep phone numbers of your family and friends handy if you need to call someone when you go into labor.  Talk with your health care provider about the possibility of a medical emergency and what will happen if that occurs. What are the advantages and disadvantages of natural childbirth? Advantages  You are in control of your labor and delivery experience.  You may be able to avoid some common medical practices, such as getting medicines or being monitored all the time.  You and your spouse or partner will work together, which can increase your bond with each other.  In most delivery centers, your family and friends can be involved in the labor and delivery process. Disadvantages  The methods of helping relieve labor pains may not work for you.  You may feel   disappointed if you change your mind during labor and decide not to have a natural childbirth. What can I expect after delivery?  You may feel very tired.  You may feel uncomfortable as your uterus contracts.  The area around your vagina will feel sore.  You may feel cold and shaky. What questions should I ask my health care  provider?  Am I a good candidate for natural childbirth?  Can you refer me to classes to learn more about natural childbirth?  How do I create a birth plan that outlines my wishes for natural childbirth? Where to find more information  American Pregnancy Association: americanpregnancy.org  American Congress of Obstetricians and Gynecologists: acog.org  American College of Nurse-Midwives: www.midwife.org Summary  Natural childbirth may be an option if you have a low risk pregnancy.  The Bradley or Lamaze Methods are techniques that can assist you in achieving a natural birth experience.  Talk to your health care provider to determine if you are a good candidate for a natural childbirth. This information is not intended to replace advice given to you by your health care provider. Make sure you discuss any questions you have with your health care provider. Document Revised: 09/06/2018 Document Reviewed: 07/24/2016 Elsevier Patient Education  2020 Elsevier Inc.  

## 2020-03-02 ENCOUNTER — Encounter: Payer: BC Managed Care – PPO | Admitting: Advanced Practice Midwife

## 2020-03-02 LAB — CERVICOVAGINAL ANCILLARY ONLY
Chlamydia: NEGATIVE
Comment: NEGATIVE
Comment: NORMAL
Neisseria Gonorrhea: NEGATIVE

## 2020-03-05 LAB — CULTURE, BETA STREP (GROUP B ONLY): Strep Gp B Culture: NEGATIVE

## 2020-03-08 ENCOUNTER — Encounter: Payer: Self-pay | Admitting: Physical Therapy

## 2020-03-08 ENCOUNTER — Other Ambulatory Visit: Payer: Self-pay

## 2020-03-08 ENCOUNTER — Ambulatory Visit: Payer: BC Managed Care – PPO | Attending: Advanced Practice Midwife | Admitting: Physical Therapy

## 2020-03-08 DIAGNOSIS — M6281 Muscle weakness (generalized): Secondary | ICD-10-CM | POA: Diagnosis present

## 2020-03-08 DIAGNOSIS — M25551 Pain in right hip: Secondary | ICD-10-CM | POA: Diagnosis present

## 2020-03-08 DIAGNOSIS — R293 Abnormal posture: Secondary | ICD-10-CM | POA: Insufficient documentation

## 2020-03-08 DIAGNOSIS — M25552 Pain in left hip: Secondary | ICD-10-CM | POA: Insufficient documentation

## 2020-03-08 DIAGNOSIS — R262 Difficulty in walking, not elsewhere classified: Secondary | ICD-10-CM

## 2020-03-08 NOTE — Therapy (Signed)
[facility] [address]  [address] [place], [place], [address] Phone: [phone]   Fax:  [phone]  Physical Therapy Evaluation  Patient Details  Name: Vicki Ford MRN: [id number] Date of Birth: February 20, 1991 Referring Provider (PT): Wynelle Bourgeois, CNM   Encounter Date: 03/08/2020   PT End of Session - 03/08/20 0948    Visit Number 1    Number of Visits 7    Date for PT Re-Evaluation 03/29/20    Authorization Type BCBS    PT Start Time 0849    PT Stop Time 0925    PT Time Calculation (min) 36 min    Activity Tolerance Patient limited by pain;Patient tolerated treatment well    Behavior During Therapy [facility] for tasks assessed/performed           Past Medical History:  Diagnosis Date  . Anxiety    takes zoloft, buspar, & atarax  . Asthma    sports induced asthma  . Depression    takes zoloft & buspar  . Headache   . Preterm labor     Past Surgical History:  Procedure Laterality Date  . CESAREAN SECTION    . GALLBLADDER SURGERY  01/15/2019  . HERNIA REPAIR      There were no vitals filed for this visit.    Subjective Assessment - 03/08/20 0851    Subjective Patient reports that she is due to have her baby this month. For the past couple weeks, she has been experiencing "lightening crotch" and c/o "fire pain" d/t the pressure her baby is putting on this region. She has been having groin pain when walking, going upstairs, standing up from a chair. Yesterday the R LE gave out on her when getting into the car, and had to have her husband lift this leg up for her. Denies N/T or B&B changes. Better with hot shower, muscle relaxants, and butterfly stretch. Has been using an Art gallery manager at stores d/t pain.    Pertinent History HA, depression, asthma, anxiety    Limitations Walking;Standing;House hold activities    How long can you walk comfortably? 3 hours    Diagnostic tests none    Patient Stated Goals  decrease pain    Currently in Pain? Yes    Pain Score 3     Pain Location Groin    Pain Orientation Left;Right    Pain Descriptors / Indicators Sharp    Pain Type Acute pain    Pain Radiating Towards to B anterior thighs              [facility] PT Assessment - 03/08/20 0858      Assessment   Medical Diagnosis [redacted] weeks gestation of pregnancy, inguinal pain    Referring Provider (PT) Wynelle Bourgeois, CNM    Onset Date/Surgical Date 02/16/20    Prior Therapy yes- for LBP      Precautions   Precaution Comments [redacted] weeks pregnant      Balance Screen   Has the patient fallen in the past 6 months No    Has the patient had a decrease in activity level because of a fear of falling?  No    Is the patient reluctant to leave their home because of a fear of falling?  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    Leisure playing with kids      Cognition   Overall Cognitive Status Within Functional Limits for tasks assessed      Sensation   Light Touch Appears Intact      Coordination   Gross Motor Movements are Fluid and Coordinated Yes      Posture/Postural Control   Posture/Postural Control Postural limitations    Postural Limitations Rounded Shoulders;Increased lumbar lordosis      ROM / Strength   AROM / PROM / Strength AROM;Strength      AROM   AROM Assessment Site Hip    Right/Left Hip Right;Left    Right Hip External Rotation  35    Right Hip Internal Rotation  40    Left Hip External Rotation  25    Left Hip Internal  Rotation  34      Strength   Strength Assessment Site Hip;Knee;Ankle    Right/Left Hip Right;Left    Right Hip Flexion 3-/5   pain   Right Hip External Rotation  4/5    Right Hip Internal Rotation 4/5   pain   Right Hip ABduction 4/5    Right Hip ADduction 3-/5   pain   Left Hip Flexion 3-/5   pain   Left Hip External Rotation 4/5   pain   Left Hip Internal Rotation 4/5    Left Hip ABduction 4/5    Left Hip ADduction 3-/5   pain   Right/Left Knee Right;Left    Right Knee Flexion 4-/5    Right Knee Extension 4/5    Left Knee Flexion 4-/5    Left Knee Extension 4/5    Right/Left Ankle Right;Left    Right Ankle Dorsiflexion 4/5    Right Ankle Plantar Flexion 4/5    Left Ankle Dorsiflexion 4/5    Left Ankle Plantar Flexion 4/5      Palpation   Palpation comment TTP over R greater trochanter, B proximal rectus femoris, hip flexors, and proximal adductors with areas of soft tissue restriction      Ambulation/Gait   Gait Pattern Step-through pattern;Abducted- right;Abducted - left;Wide base of support;Lateral trunk lean to left;Lateral trunk lean to right    Ambulation Surface Level;Indoor                      Objective measurements completed on examination: See above findings.               PT Education - 03/08/20 0947    Education Details prognosis, POC, HEP    Person(s) Educated Patient    Methods Explanation;Demonstration;Tactile cues;Verbal cues;Handout    Comprehension Verbalized understanding;Returned demonstration            PT Short Term Goals - 03/08/20 1212      PT SHORT TERM GOAL #1   Title Patient to be independent with initial HEP.    Time 1    Period Weeks    Status New    Target Date 03/15/20             PT Long Term Goals - 03/08/20 1212  PT LONG TERM GOAL #1   Title Patient to be independent with advanced HEP.    Time 3    Period Weeks    Status New    Target Date 03/29/20      PT LONG TERM GOAL #2   Title  Patient to demonstrate B LE strength >/=4/5.    Time 6    Period Weeks    Status New    Target Date 03/29/20      PT LONG TERM GOAL #3   Title Patient to navigate 13 stairs with 1 handrail as needed with 0/10 pain.    Time 3    Period Weeks    Status New    Target Date 03/29/20      PT LONG TERM GOAL #4   Title Patient to perform 5 sit to stands with 0/10 pain.    Time 3    Period Weeks    Status New    Target Date 03/29/20                  Plan - 03/08/20 1207    Clinical Impression Statement Patient is a 29y/o F, [redacted] weeks pregnant, presenting to OPPT with c/o "a couple weeks" of B insidious groin pain. Patient reports sharp, "lightening" pain when walking, ascending stairs, and standing up from a chair. Patient also notes difficulty performing car transfers, as her husband recently had to assist her R LE to get into the car, and is having to use an electric scooter when shopping. Denies N/T or B&B changes. Patient today presenting with rounded shoulders and increased lumbar lordosis, decreased hip AROM, marked B LE weakness, TTP over R greater trochanter, B proximal rectus femoris, hip flexors, and proximal adductors, and gait deviations. Patient with poor positional tolerance d/t discomfort and effects of pregnancy. Patient was educated on gentle stretching and AROM HEP- patient reported understanding. Would benefit from skilled PT services 2x/week for 3 weeks to address aforementioned impairments.    Personal Factors and Comorbidities Sex;Comorbidity 3+;Past/Current Experience;Profession;Time since onset of injury/illness/exacerbation    Comorbidities HA, depression, asthma, anxiety    Examination-Activity Limitations Sit;Bed Mobility;Sleep;Squat;Bend;Stairs;Caring for Others;Carry;Toileting;Dressing;Transfers;Hygiene/Grooming;Lift;Locomotion Level;Reach Overhead    Examination-Participation Restrictions Church;Cleaning;Community Activity;Shop;Driving;Yard Work;Laundry;Meal  Prep;Occupation    Stability/Clinical Decision Making Stable/Uncomplicated    Clinical Decision Making Low    Rehab Potential Good    PT Frequency 2x / week    PT Duration 3 weeks    PT Treatment/Interventions ADLs/Self Care Home Management;Cryotherapy;Moist Heat;Balance training;Therapeutic exercise;Therapeutic activities;Functional mobility training;Stair training;Gait training;Ultrasound;Neuromuscular re-education;Patient/family education;Manual techniques;Taping;Energy conservation;Dry needling;Passive range of motion    PT Next Visit Plan hip FOTO; reassess HEP; progress gentle hip flexor/adductor stretching and strengthening to tolerance    Consulted and Agree with Plan of Care Patient           Patient will benefit from skilled therapeutic intervention in order to improve the following deficits and impairments:  Abnormal gait, Decreased activity tolerance, Decreased strength, Pain, Difficulty walking, Increased muscle spasms, Improper body mechanics, Decreased range of motion, Postural dysfunction, Impaired flexibility  Visit Diagnosis: Pain in left hip  Pain in right hip  Muscle weakness (generalized)  Difficulty in walking, not elsewhere classified  Abnormal posture     Problem List Patient Active Problem List   Diagnosis Date Noted  . History of cesarean delivery 10/13/2019  . Gastroesophageal reflux during pregnancy, antepartum 10/13/2019  . PUPP (pruritic urticarial papules and plaques of pregnancy) 10/13/2019  . Supervision of other normal pregnancy, antepartum  08/19/2019  . Twin liveborn by cesarean 08/19/2019  . PTSD (post-traumatic stress disorder) 07/18/2017  . MDD (major depressive disorder), recurrent severe, without psychosis (HCC) 12/26/2013  . Sciatica 12/26/2013  . Attention or concentration deficit 02/08/2010  . Migraine 02/08/2010  . Allergic rhinitis 07/22/2008  . Exercise-induced asthma 07/22/2008     Anette GuarneriYevgeniya Latosha Gaylord, PT, DPT 03/08/20  12:15 PM   [facility] [place] [address]  [address] [place], [place], [address] Phone: [phone]   Fax:  [phone]  Name: Vickii PennaCristal Ford MRN: [id number] Date of Birth: 01/03/1991

## 2020-03-10 ENCOUNTER — Ambulatory Visit (INDEPENDENT_AMBULATORY_CARE_PROVIDER_SITE_OTHER): Payer: BC Managed Care – PPO | Admitting: Family Medicine

## 2020-03-10 ENCOUNTER — Other Ambulatory Visit: Payer: Self-pay

## 2020-03-10 VITALS — BP 116/69 | HR 100 | Wt 200.0 lb

## 2020-03-10 DIAGNOSIS — F332 Major depressive disorder, recurrent severe without psychotic features: Secondary | ICD-10-CM

## 2020-03-10 DIAGNOSIS — Z3A37 37 weeks gestation of pregnancy: Secondary | ICD-10-CM

## 2020-03-10 DIAGNOSIS — O99619 Diseases of the digestive system complicating pregnancy, unspecified trimester: Secondary | ICD-10-CM

## 2020-03-10 DIAGNOSIS — Z3480 Encounter for supervision of other normal pregnancy, unspecified trimester: Secondary | ICD-10-CM

## 2020-03-10 DIAGNOSIS — K219 Gastro-esophageal reflux disease without esophagitis: Secondary | ICD-10-CM

## 2020-03-10 DIAGNOSIS — Z98891 History of uterine scar from previous surgery: Secondary | ICD-10-CM

## 2020-03-10 DIAGNOSIS — O2686 Pruritic urticarial papules and plaques of pregnancy (PUPPP): Secondary | ICD-10-CM

## 2020-03-10 MED ORDER — SERTRALINE HCL 50 MG PO TABS: 50.0000 mg | ORAL_TABLET | Freq: Every day | ORAL | 3 refills | Status: DC

## 2020-03-10 NOTE — Progress Notes (Signed)
   PRENATAL VISIT NOTE  Subjective:  Vicki Ford is a 29 y.o. [phone] at [redacted]w[redacted]d being seen today for ongoing prenatal care.  She is currently monitored for the following issues for this high-risk pregnancy and has Supervision of other normal pregnancy, antepartum; Allergic rhinitis; Attention or concentration deficit; Exercise-induced asthma; PTSD (post-traumatic stress disorder); MDD (major depressive disorder), recurrent severe, without psychosis (HCC); Migraine; Sciatica; Twin liveborn by cesarean; History of cesarean delivery; Gastroesophageal reflux during pregnancy, antepartum; and PUPP (pruritic urticarial papules and plaques of pregnancy) on their problem list.  Patient reports increasing anxiety.  Contractions: Irregular. Vag. Bleeding: None.  Movement: Present. Denies leaking of fluid.   The following portions of the patient's history were reviewed and updated as appropriate: allergies, current medications, past family history, past medical history, past social history, past surgical history and problem list.   Objective:   Vitals:   03/10/20 1608  BP: 116/69  Pulse: 100  Weight: 200 lb (90.7 kg)    Fetal Status: Fetal Heart Rate (bpm): 145 Fundal Height: 37 cm Movement: Present  Presentation: Vertex  General:  Alert, oriented and cooperative. Patient is in no acute distress.  Skin: Skin is warm and dry. No rash noted.   Cardiovascular: Normal heart rate noted  Respiratory: Normal respiratory effort, no problems with respiration noted  Abdomen: Soft, gravid, appropriate for gestational age.  Pain/Pressure: Present     Pelvic: Cervical exam deferred        Extremities: Normal range of motion.  Edema: Trace  Mental Status: Normal mood and affect. Normal behavior. Normal judgment and thought content.   Assessment and Plan:  Pregnancy: [id number] at [redacted]w[redacted]d 1. [redacted] weeks gestation of pregnancy  2. Supervision of other normal pregnancy, antepartum FHT and FH normal  3. History of  cesarean delivery TOLAC  4. Gastroesophageal reflux during pregnancy, antepartum Fairly well controlled on meds  5. 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 MDD (major depressive disorder), recurrent severe, without psychosis (HCC) Start zolft 50mg  daily  Preterm labor symptoms and general obstetric precautions including but not limited to vaginal bleeding, contractions, leaking of fluid and fetal movement were reviewed in detail with the patient. Please refer to After Visit Summary for other counseling recommendations.   Return in about 1 week (around 03/17/2020) for OB f/u.  Future Appointments  Date Time Provider Department Center  03/11/2020  8:45 AM 03/13/2020, PT [facility]  03/15/2020  9:30 AM 03/17/2020, MD [facility] None  03/18/2020  8:45 AM 03/20/2020, PT [facility]  03/22/2020  8:45 AM 03/24/2020, PT [facility]  03/25/2020  8:45 AM 03/27/2020, PT [facility]    Maryruth Bun, DO

## 2020-03-11 ENCOUNTER — Inpatient Hospital Stay (HOSPITAL_COMMUNITY)
Admission: AD | Admit: 2020-03-11 | Discharge: 2020-03-11 | Disposition: A | Discharge status: Home / Self Care | Payer: BC Managed Care – PPO | Attending: Obstetrics & Gynecology | Admitting: Obstetrics & Gynecology

## 2020-03-11 ENCOUNTER — Other Ambulatory Visit: Payer: Self-pay

## 2020-03-11 ENCOUNTER — Ambulatory Visit: Payer: BC Managed Care – PPO | Admitting: Physical Therapy

## 2020-03-11 ENCOUNTER — Encounter (HOSPITAL_COMMUNITY): Payer: Self-pay | Admitting: Obstetrics & Gynecology

## 2020-03-11 DIAGNOSIS — Z79899 Other long term (current) drug therapy: Secondary | ICD-10-CM | POA: Diagnosis not present

## 2020-03-11 DIAGNOSIS — O26893 Other specified pregnancy related conditions, third trimester: Secondary | ICD-10-CM

## 2020-03-11 DIAGNOSIS — F329 Major depressive disorder, single episode, unspecified: Secondary | ICD-10-CM | POA: Insufficient documentation

## 2020-03-11 DIAGNOSIS — O99343 Other mental disorders complicating pregnancy, third trimester: Secondary | ICD-10-CM | POA: Insufficient documentation

## 2020-03-11 DIAGNOSIS — O2686 Pruritic urticarial papules and plaques of pregnancy (PUPPP): Secondary | ICD-10-CM | POA: Diagnosis not present

## 2020-03-11 DIAGNOSIS — O471 False labor at or after 37 completed weeks of gestation: Secondary | ICD-10-CM | POA: Insufficient documentation

## 2020-03-11 DIAGNOSIS — Z3A37 37 weeks gestation of pregnancy: Secondary | ICD-10-CM

## 2020-03-11 DIAGNOSIS — K529 Noninfective gastroenteritis and colitis, unspecified: Secondary | ICD-10-CM | POA: Diagnosis not present

## 2020-03-11 DIAGNOSIS — O99613 Diseases of the digestive system complicating pregnancy, third trimester: Secondary | ICD-10-CM | POA: Insufficient documentation

## 2020-03-11 DIAGNOSIS — F419 Anxiety disorder, unspecified: Secondary | ICD-10-CM | POA: Diagnosis not present

## 2020-03-11 DIAGNOSIS — Z3480 Encounter for supervision of other normal pregnancy, unspecified trimester: Secondary | ICD-10-CM

## 2020-03-11 DIAGNOSIS — O479 False labor, unspecified: Secondary | ICD-10-CM

## 2020-03-11 DIAGNOSIS — O368130 Decreased fetal movements, third trimester, not applicable or unspecified: Secondary | ICD-10-CM | POA: Diagnosis present

## 2020-03-11 DIAGNOSIS — Z98891 History of uterine scar from previous surgery: Secondary | ICD-10-CM

## 2020-03-11 DIAGNOSIS — K219 Gastro-esophageal reflux disease without esophagitis: Secondary | ICD-10-CM | POA: Diagnosis not present

## 2020-03-11 LAB — CBC
HCT: 38.5 % (ref 36.0–46.0)
Hemoglobin: 12.2 g/dL (ref 12.0–15.0)
MCH: 26.6 pg (ref 26.0–34.0)
MCHC: 31.7 g/dL (ref 30.0–36.0)
MCV: 83.9 fL (ref 80.0–100.0)
Platelets: 232 10*3/uL (ref 150–400)
RBC: 4.59 MIL/uL (ref 3.87–5.11)
RDW: 14.6 % (ref 11.5–15.5)
WBC: 12.8 10*3/uL — ABNORMAL HIGH (ref 4.0–10.5)
nRBC: 0 % (ref 0.0–0.2)

## 2020-03-11 LAB — BASIC METABOLIC PANEL
Anion gap: 12 (ref 5–15)
BUN: 8 mg/dL (ref 6–20)
CO2: 19 mmol/L — ABNORMAL LOW (ref 22–32)
Calcium: 9.4 mg/dL (ref 8.9–10.3)
Chloride: 104 mmol/L (ref 98–111)
Creatinine, Ser: 0.74 mg/dL (ref 0.44–1.00)
GFR, Estimated: >60 mL/min (ref >60)
Glucose, Bld: 92 mg/dL (ref 70–99)
Potassium: 3.9 mmol/L (ref 3.5–5.1)
Sodium: 135 mmol/L (ref 135–145)

## 2020-03-11 LAB — URINALYSIS, ROUTINE W REFLEX MICROSCOPIC
Bilirubin Urine: NEGATIVE
Glucose, UA: NEGATIVE mg/dL
Hgb urine dipstick: NEGATIVE
Ketones, ur: 80 mg/dL — AB
Nitrite: NEGATIVE
Protein, ur: 100 mg/dL — AB
Specific Gravity, Urine: 1.03 (ref 1.005–1.030)
pH: 5 (ref 5.0–8.0)

## 2020-03-11 MED ORDER — ONDANSETRON 4 MG PO TBDP: 4.0000 mg | ORAL_TABLET | Freq: Four times a day (QID) | ORAL | 0 refills | Status: DC | PRN

## 2020-03-11 MED ORDER — LACTATED RINGERS IV BOLUS
1000.0000 mL | Freq: Once | INTRAVENOUS | Status: AC
  Administered 2020-03-11: 1000 mL via INTRAVENOUS

## 2020-03-11 MED ORDER — ONDANSETRON HCL 4 MG/2ML IJ SOLN
4.0000 mg | Freq: Once | INTRAMUSCULAR | Status: AC
  Administered 2020-03-11: 4 mg via INTRAVENOUS
  Filled 2020-03-11: qty 2

## 2020-03-11 NOTE — MAU Provider Note (Addendum)
History     CSN: [id number]  Arrival date and time: 03/11/20 1932   First Provider Initiated Contact with Patient 03/11/20 2124      Chief Complaint  Patient presents with  . Decreased Fetal Movement  . Abdominal Pain   29 y.o. @37 .6 wks presenting with N/V/D. Reports onset around 4am. Cannot tolerate anything po. Diarrhea is watery, no blood. Denies sick contacts. No fevers. Had takeout last night for dinner. Reports decreased FM today. Also reports cramping, unsure if this is ctx. Denies LOF and VB.   OB History    Gravida  3   Para  1   Term  0   Preterm  1   AB  1   Living  2     SAB  1   TAB  0   Ectopic  0   Multiple  1   Live Births  2           Past Medical History:  Diagnosis Date  . Anxiety    takes zoloft, buspar, & atarax  . Asthma    sports induced asthma  . Depression    takes zoloft & buspar  . Headache   . Preterm labor     Past Surgical History:  Procedure Laterality Date  . CESAREAN SECTION    . GALLBLADDER SURGERY  01/15/2019  . HERNIA REPAIR      Family History  Problem Relation Age of Onset  . Cancer Mother   . Depression Mother   . ADD / ADHD Sister   . Depression Sister     Social History   Tobacco Use  . Smoking status: Former Smoker    Quit date: 03/30/2019    Years since quitting: 0.9  . Smokeless tobacco: Never Used  . Tobacco comment: not since pregnancy  Vaping Use  . Vaping Use: Former  Substance Use Topics  . Alcohol use: Not Currently  . Drug use: Not Currently    Comment: as a teen    Allergies: No Known Allergies  Medications Prior to Admission  Medication Sig Dispense Refill Last Dose  . cyclobenzaprine (FLEXERIL) 5 MG tablet Take 1 tablet (5 mg total) by mouth 3 (three) times daily as needed for muscle spasms. 30 tablet 0 Past Week at Unknown time  . hydrOXYzine (ATARAX/VISTARIL) 25 MG tablet Take 1 tablet (25 mg total) by mouth every 6 (six) hours as needed for itching. 30 tablet 2 Past  Month at Unknown time  . lansoprazole (PREVACID) 15 MG capsule Take 1 capsule (15 mg total) by mouth daily at 12 noon. 60 capsule 2 03/10/2020 at Unknown time  . sertraline (ZOLOFT) 50 MG tablet Take 1 tablet (50 mg total) by mouth daily. 30 tablet 3 03/10/2020 at Unknown time  . Ascorbic Acid (VITAMIN C WITH ROSE HIPS) 1000 MG tablet Take 1,000 mg by mouth daily.     . folic acid (FOLVITE) 800 MCG tablet Take 400 mcg by mouth daily. (Patient not taking: Reported on 11/28/2019)     . hydrOXYzine (ATARAX/VISTARIL) 25 MG tablet Take 25 mg by mouth 3 (three) times daily as needed. (Patient not taking: Reported on 12/11/2019)     . Prenatal Vit-Fe Fumarate-FA (PRENATAL MULTIVITAMIN) TABS tablet Take 1 tablet by mouth daily at 12 noon. (Patient not taking: Reported on 11/28/2019)       Review of Systems  Constitutional: Negative for chills and fever.  Gastrointestinal: Positive for abdominal pain, diarrhea, nausea and vomiting.  Genitourinary: Negative  for vaginal bleeding and vaginal discharge.  Musculoskeletal: Positive for back pain.   Physical Exam   Blood pressure 115/67, pulse 98, temperature 98.6 F (37 C), temperature source Oral, resp. rate 20, last menstrual period 06/09/2019, SpO2 100 %, unknown if currently breastfeeding.  Physical Exam Vitals and nursing note reviewed. Exam conducted with a chaperone present.  Constitutional:      General: She is not in acute distress.    Appearance: Normal appearance.  HENT:     Head: Normocephalic and atraumatic.  Cardiovascular:     Rate and Rhythm: Normal rate.  Pulmonary:     Effort: Pulmonary effort is normal. No respiratory distress.  Genitourinary:    Comments: VE: closed/60 Musculoskeletal:        General: Normal range of motion.  Skin:    General: Skin is warm and dry.  Neurological:     General: No focal deficit present.     Mental Status: She is alert and oriented to person, place, and time.  Psychiatric:        Mood and  Affect: Mood normal.        Behavior: Behavior normal.   EFM: 140 bpm, mod variability, + accels, no decels Toco: irreg  No results found for this or any previous visit (from the past 24 hour(s)).  MAU Course  Procedures LR Zofran  MDM Labs ordered. Transfer of care given to Luvenia Starch, CNM  03/11/2020 9:46 PM   MDM: PT symptoms improved significantly after IV fluids and Zofran. Contractions mild and irregular, unchanged per pt from previous weeks of pregnancy, so no evidence of active labor.  D/C home with Rx for Zofran. F/U in office as scheduled.  Assessment and Plan    1. Gastroenteritis   2. History of cesarean delivery   3. Gastroesophageal reflux during pregnancy, antepartum   4. PUPP (pruritic urticarial papules and plaques of pregnancy)   5. Supervision of other normal pregnancy, antepartum   6. Threatened labor at term   7. [redacted] weeks gestation of pregnancy     D/C home with labor precautions and dehydration precautions  Sharen Counter, CNM 11:17 PM

## 2020-03-11 NOTE — ED Triage Notes (Addendum)
Emergency Medicine Provider OB Triage Evaluation Note  Vicki Ford is a 29 y.o. female, [id number], at [redacted]w[redacted]d gestation who presents to the emergency department with complaints of abdominal pain, nausea, emesis x 1 day.  Pain is constant.  She describes it as cramping.  Rates the pain 7 of 10 in severity.  Has had 6 episodes of emesis, it was nonbloody nonbilious.  No medications for symptoms prior to arrival.  Review of  Systems  Positive: abdominal pain, emesis, nausea Negative: fever, chills, vaginal bleeding, back pain  Physical Exam  BP 125/84 (BP Location: Right Arm)   Pulse (!) 110   Temp 98.1 F (36.7 C) (Oral)   Resp 20   LMP 06/09/2019 (Exact Date)   SpO2 100%  General: Awake, no distress  HEENT: Atraumatic  Resp: Normal effort  Cardiac: Tachycardic to 110 Abd: Gravid abdomen, generalized tendernes MSK: Moves all extremities without difficulty Neuro: Speech clear  Medical Decision Making  Pt evaluated for pregnancy concern and is stable for transfer to MAU. Pt is in agreement with plan for transfer.  7:53 PM Discussed with MAU APP, Vicki Ford, who accepts patient in transfer.  Clinical Impression  No diagnosis found.     Sherene Sires, PA-C 03/11/20 [address] [place] 03/11/20 2123

## 2020-03-11 NOTE — ED Notes (Signed)
PA at triage with the pt.

## 2020-03-11 NOTE — MAU Note (Addendum)
.  Vicki Ford is a 29 y.o. at [redacted]w[redacted]d here in MAU reporting: she woke up at 0400 and began vomiting on the hour until recently. Pt reports she has had watery stools all day as well. Pt reports she has not been able to keep any food or water down all day. Pt reports her lower back and lower abdomen are cramping, rating them, both at a 7/10. Pt reports her upper legs have felt numb. Pt reports she was placed back on Zoloft yesterday and her last dose was at 1900. Pt reports she has not taken any medications today. Pt reports she has felt the baby move all day but he has not been as active today. No VB. No LOF but endorses increased white discharge.  FHT: 143 initial Lab orders placed from triage:  UA

## 2020-03-11 NOTE — ED Notes (Signed)
Dr. Renaye Rakers and Margarette Asal, PA notified to assess pt on triage area.

## 2020-03-15 ENCOUNTER — Other Ambulatory Visit: Payer: Self-pay

## 2020-03-15 ENCOUNTER — Ambulatory Visit (INDEPENDENT_AMBULATORY_CARE_PROVIDER_SITE_OTHER): Payer: BC Managed Care – PPO | Admitting: Obstetrics & Gynecology

## 2020-03-15 DIAGNOSIS — Z3480 Encounter for supervision of other normal pregnancy, unspecified trimester: Secondary | ICD-10-CM

## 2020-03-15 DIAGNOSIS — O99619 Diseases of the digestive system complicating pregnancy, unspecified trimester: Secondary | ICD-10-CM

## 2020-03-15 DIAGNOSIS — Z98891 History of uterine scar from previous surgery: Secondary | ICD-10-CM

## 2020-03-15 DIAGNOSIS — O2686 Pruritic urticarial papules and plaques of pregnancy (PUPPP): Secondary | ICD-10-CM

## 2020-03-15 DIAGNOSIS — K219 Gastro-esophageal reflux disease without esophagitis: Secondary | ICD-10-CM

## 2020-03-15 NOTE — Progress Notes (Signed)
   PRENATAL VISIT NOTE  Subjective:  Vicki Ford is a 29 y.o. [id number] at [redacted]w[redacted]d being seen today for ongoing prenatal care.  She is currently monitored for the following issues for this high-risk pregnancy and has Supervision of other normal pregnancy, antepartum; Allergic rhinitis; Attention or concentration deficit; Exercise-induced asthma; PTSD (post-traumatic stress disorder); MDD (major depressive disorder), recurrent severe, without psychosis (HCC); Migraine; Sciatica; Twin liveborn by cesarean; History of cesarean delivery; Gastroesophageal reflux during pregnancy, antepartum; and PUPP (pruritic urticarial papules and plaques of pregnancy) on their problem list.  Patient reports no complaints.  Contractions: Irregular. Vag. Bleeding: None.  Movement: Present. Denies leaking of fluid.   The following portions of the patient's history were reviewed and updated as appropriate: allergies, current medications, past family history, past medical history, past social history, past surgical history and problem list.   Objective:   Vitals:   03/15/20 0928  BP: 100/70  Pulse: (!) 105  Weight: 200 lb (90.7 kg)    Fetal Status: Fetal Heart Rate (bpm): 140   Movement: Present     General:  Alert, oriented and cooperative. Patient is in no acute distress.  Skin: Skin is warm and dry. No rash noted.   Cardiovascular: Normal heart rate noted  Respiratory: Normal respiratory effort, no problems with respiration noted  Abdomen: Soft, gravid, appropriate for gestational age.  Pain/Pressure: Present     Pelvic: Cervical exam deferred        Extremities: Normal range of motion.  Edema: Trace  Mental Status: Normal mood and affect. Normal behavior. Normal judgment and thought content.   Assessment and Plan:  Pregnancy: [id number] at [redacted]w[redacted]d 1. History of cesarean delivery of twins- scheduled.   Desires TOLAC  2. Gastroesophageal reflux during pregnancy, antepartum  3. PUPP (pruritic urticarial  papules and plaques of pregnancy)  4. Supervision of other normal pregnancy, antepartum FHR and FH WNL.    Term labor symptoms and general obstetric precautions including but not limited to vaginal bleeding, contractions, leaking of fluid and fetal movement were reviewed in detail with the patient. Please refer to After Visit Summary for other counseling recommendations.   Return in about 1 week (around 03/22/2020).  Future Appointments  Date Time Provider Department Center  03/18/2020  8:45 AM Maryruth Bun, PT [facility]  03/22/2020  8:45 AM Maryruth Bun, PT [facility]  03/25/2020  8:45 AM Maryruth Bun, PT [facility]    Willodean Rosenthal, MD

## 2020-03-18 ENCOUNTER — Ambulatory Visit: Payer: BC Managed Care – PPO | Admitting: Physical Therapy

## 2020-03-22 ENCOUNTER — Ambulatory Visit: Payer: BC Managed Care – PPO | Admitting: Physical Therapy

## 2020-03-22 ENCOUNTER — Other Ambulatory Visit: Payer: Self-pay

## 2020-03-22 ENCOUNTER — Encounter: Payer: Self-pay | Admitting: Physical Therapy

## 2020-03-22 DIAGNOSIS — M25552 Pain in left hip: Secondary | ICD-10-CM

## 2020-03-22 DIAGNOSIS — R293 Abnormal posture: Secondary | ICD-10-CM

## 2020-03-22 DIAGNOSIS — R262 Difficulty in walking, not elsewhere classified: Secondary | ICD-10-CM

## 2020-03-22 DIAGNOSIS — M25551 Pain in right hip: Secondary | ICD-10-CM

## 2020-03-22 DIAGNOSIS — M6281 Muscle weakness (generalized): Secondary | ICD-10-CM

## 2020-03-22 NOTE — Therapy (Addendum)
[facility] [address]  [place], [place], [address] Phone: [phone]   Fax:  [phone]  Physical Therapy Treatment  Patient Details  Name: Vicki Ford MRN: [id number] Date of Birth: [birth year] Referring Provider (PT): Hansel Feinstein, CNM   Encounter Date: 03/22/2020   PT End of Session - 03/22/20 0937    Visit Number 2    Number of Visits 7    Date for PT Re-Evaluation 03/29/20    Authorization Type BCBS    PT Start Time [phone]    PT Stop Time 0930    PT Time Calculation (min) 39 min    Activity Tolerance Patient tolerated treatment well    Behavior During Therapy [facility] for tasks assessed/performed           Past Medical History:  Diagnosis Date  . Anxiety    takes zoloft, buspar, & atarax  . Asthma    sports induced asthma  . Depression    takes zoloft & buspar  . Headache   . Preterm labor     Past Surgical History:  Procedure Laterality Date  . CESAREAN SECTION    . GALLBLADDER SURGERY  01/15/2019  . HERNIA REPAIR      There were no vitals filed for this visit.   Subjective Assessment - 03/22/20 0852    Subjective Reporting that the exercises are going fine. Notes that she will be reaching 40 weeks of pregnancy this week, and unsure if she will be induced. Reports that she has tried massaging her legs on her own which has helped as well as a hot shower. Denies hx of blood clots.    Pertinent History HA, depression, asthma, anxiety    Diagnostic tests none    Patient Stated Goals decrease pain    Currently in Pain? Yes    Pain Score 3     Pain Location Groin    Pain Orientation Anterior;Right;Left    Pain Descriptors / Indicators Dull;Sharp;Shooting    Pain Type Acute pain              [facility] PT Assessment - 03/22/20 0001      Observation/Other Assessments   Focus on Therapeutic Outcomes (FOTO)  pelvis: 42 (34% predicted)                         [facility] Adult PT  Treatment/Exercise - 03/22/20 0001      Exercises   Exercises Knee/Hip;Lumbar      Lumbar Exercises: Seated   Other Seated Lumbar Exercises prayer stretch with green pball 5x5"    good tolerance   Other Seated Lumbar Exercises ant/pos pelvic tilts to tolerance 2x10   cues for rhythmic breathing     Knee/Hip Exercises: Stretches   Hip Flexor Stretch Right;Left;1 rep;30 seconds    Hip Flexor Stretch Limitations standing at counter    Other Knee/Hip Stretches R/L adductor stretch in sitting 2x30"    Other Knee/Hip Stretches R/L modified hip ER stretch 30" each      Knee/Hip Exercises: Seated   Clamshell with TheraBand Yellow   2x10   Other Seated Knee/Hip Exercises sitting hip adduction AROM 10x; With ball squeeze 10x3" to tolerance      Manual Therapy   Manual Therapy Soft tissue mobilization;Myofascial release    Manual therapy comments sitting    Soft tissue mobilization gentle STM along R adductors   TTP along length of muscle with several  tender trigger pts   Myofascial Release manual TPR to R adductors   good twitch response                 PT Education - 03/22/20 0937    Education Details administered yellow loop for sitting clams    Person(s) Educated Patient    Methods Explanation;Demonstration    Comprehension Verbalized understanding;Returned demonstration            PT Short Term Goals - 03/22/20 0942      PT SHORT TERM GOAL #1   Title Patient to be independent with initial HEP.    Time 1    Period Weeks    Status Achieved    Target Date 03/15/20             PT Long Term Goals - 03/22/20 0942      PT LONG TERM GOAL #1   Title Patient to be independent with advanced HEP.    Time 3    Period Weeks    Status On-going      PT LONG TERM GOAL #2   Title Patient to demonstrate B LE strength >/=4/5.    Time 6    Period Weeks    Status On-going      PT LONG TERM GOAL #3   Title Patient to navigate 13 stairs with 1 handrail as needed with 0/10  pain.    Time 3    Period Weeks    Status On-going      PT LONG TERM GOAL #4   Title Patient to perform 5 sit to stands with 0/10 pain.    Time 3    Period Weeks    Status On-going                 Plan - 03/22/20 0940    Clinical Impression Statement Patient noting no issues with HEP. Noting that she will be reaching 40 weeks of pregnancy this week, and unsure if she will be induced. Reports that she has tried massaging her legs on her own which has helped as well as a hot shower. Denies hx of blood clots. Provided cues for rhythmic breathing with pelvic tilts, however patient able to perform good ROM. Sightly improved tolerance for sitting hip adduction AROM, and able to tolerate addition of very light resisted isometric. Also demonstrated better tolerance for stretching today in different planes of motion. Palpated R medial thigh d/t patient's c/o pain and tightness, which presented with several tender trigger points along length of the R adductors. Patient with good twitch response to MT. Ended session without complaints. Patient progressing well towards goals.    Comorbidities HA, depression, asthma, anxiety    PT Treatment/Interventions ADLs/Self Care Home Management;Cryotherapy;Moist Heat;Balance training;Therapeutic exercise;Therapeutic activities;Functional mobility training;Stair training;Gait training;Ultrasound;Neuromuscular re-education;Patient/family education;Manual techniques;Taping;Energy conservation;Dry needling;Passive range of motion    PT Next Visit Plan progress gentle hip flexor/adductor stretching and strengthening to tolerance    Consulted and Agree with Plan of Care Patient           Patient will benefit from skilled therapeutic intervention in order to improve the following deficits and impairments:  Abnormal gait, Decreased activity tolerance, Decreased strength, Pain, Difficulty walking, Increased muscle spasms, Improper body mechanics, Decreased range of  motion, Postural dysfunction, Impaired flexibility  Visit Diagnosis: Pain in left hip  Pain in right hip  Muscle weakness (generalized)  Difficulty in walking, not elsewhere classified  Abnormal posture     Problem List Patient  Active Problem List   Diagnosis Date Noted  . History of cesarean delivery 10/13/2019  . Gastroesophageal reflux during pregnancy, antepartum 10/13/2019  . PUPP (pruritic urticarial papules and plaques of pregnancy) 10/13/2019  . Supervision of other normal pregnancy, antepartum 08/19/2019  . Twin liveborn by cesarean 08/19/2019  . PTSD (post-traumatic stress disorder) 07/18/2017  . MDD (major depressive disorder), recurrent severe, without psychosis ([place]) 12/26/2013  . Sciatica 12/26/2013  . Attention or concentration deficit 02/08/2010  . Migraine 02/08/2010  . Allergic rhinitis 07/22/2008  . Exercise-induced asthma 07/22/2008     Janene Harvey, PT, DPT 03/22/20 10:05 AM   [facility] [address]  [place], [place], [address] Phone: [phone]   Fax:  [phone]  Name: Sakina Briones MRN: [id number] Date of Birth: [birth year]   PHYSICAL THERAPY DISCHARGE SUMMARY  Visits from Start of Care: 2  Current functional level related to goals / functional outcomes: Unable to assess; patient did not return d/t giving birth during POC   Remaining deficits: Unable to assess   Education / Equipment: HEP  Plan: Patient agrees to discharge.  Patient goals were not met. Patient is being discharged due to a change in medical status.  ?????     Janene Harvey, PT, DPT 04/14/20 3:46 PM

## 2020-03-24 ENCOUNTER — Encounter: Payer: BC Managed Care – PPO | Admitting: Family Medicine

## 2020-03-25 ENCOUNTER — Inpatient Hospital Stay (HOSPITAL_COMMUNITY)
Admission: AD | Admit: 2020-03-25 | Discharge: 2020-03-27 | DRG: 797 | Disposition: A | Discharge status: Home / Self Care | Payer: BC Managed Care – PPO | Attending: Obstetrics and Gynecology | Admitting: Obstetrics and Gynecology

## 2020-03-25 ENCOUNTER — Inpatient Hospital Stay (HOSPITAL_COMMUNITY): Payer: BC Managed Care – PPO | Admitting: Anesthesiology

## 2020-03-25 ENCOUNTER — Ambulatory Visit: Payer: BC Managed Care – PPO | Admitting: Physical Therapy

## 2020-03-25 ENCOUNTER — Encounter (HOSPITAL_COMMUNITY): Payer: Self-pay | Admitting: Obstetrics & Gynecology

## 2020-03-25 ENCOUNTER — Other Ambulatory Visit: Payer: Self-pay

## 2020-03-25 DIAGNOSIS — O99619 Diseases of the digestive system complicating pregnancy, unspecified trimester: Secondary | ICD-10-CM

## 2020-03-25 DIAGNOSIS — Z302 Encounter for sterilization: Secondary | ICD-10-CM

## 2020-03-25 DIAGNOSIS — J45990 Exercise induced bronchospasm: Secondary | ICD-10-CM | POA: Diagnosis present

## 2020-03-25 DIAGNOSIS — O99344 Other mental disorders complicating childbirth: Secondary | ICD-10-CM | POA: Diagnosis present

## 2020-03-25 DIAGNOSIS — Z20822 Contact with and (suspected) exposure to covid-19: Secondary | ICD-10-CM | POA: Diagnosis present

## 2020-03-25 DIAGNOSIS — Z87891 Personal history of nicotine dependence: Secondary | ICD-10-CM | POA: Diagnosis not present

## 2020-03-25 DIAGNOSIS — Z3480 Encounter for supervision of other normal pregnancy, unspecified trimester: Secondary | ICD-10-CM

## 2020-03-25 DIAGNOSIS — O2686 Pruritic urticarial papules and plaques of pregnancy (PUPPP): Secondary | ICD-10-CM

## 2020-03-25 DIAGNOSIS — O34219 Maternal care for unspecified type scar from previous cesarean delivery: Secondary | ICD-10-CM | POA: Diagnosis present

## 2020-03-25 DIAGNOSIS — F4310 Post-traumatic stress disorder, unspecified: Secondary | ICD-10-CM | POA: Diagnosis present

## 2020-03-25 DIAGNOSIS — F332 Major depressive disorder, recurrent severe without psychotic features: Secondary | ICD-10-CM | POA: Diagnosis present

## 2020-03-25 DIAGNOSIS — O9962 Diseases of the digestive system complicating childbirth: Secondary | ICD-10-CM | POA: Diagnosis present

## 2020-03-25 DIAGNOSIS — K219 Gastro-esophageal reflux disease without esophagitis: Secondary | ICD-10-CM | POA: Diagnosis present

## 2020-03-25 DIAGNOSIS — Z3A39 39 weeks gestation of pregnancy: Secondary | ICD-10-CM

## 2020-03-25 DIAGNOSIS — O34211 Maternal care for low transverse scar from previous cesarean delivery: Secondary | ICD-10-CM

## 2020-03-25 DIAGNOSIS — Z9851 Tubal ligation status: Secondary | ICD-10-CM

## 2020-03-25 DIAGNOSIS — O4202 Full-term premature rupture of membranes, onset of labor within 24 hours of rupture: Secondary | ICD-10-CM

## 2020-03-25 DIAGNOSIS — G43909 Migraine, unspecified, not intractable, without status migrainosus: Secondary | ICD-10-CM | POA: Diagnosis present

## 2020-03-25 DIAGNOSIS — Z98891 History of uterine scar from previous surgery: Secondary | ICD-10-CM

## 2020-03-25 DIAGNOSIS — O26893 Other specified pregnancy related conditions, third trimester: Secondary | ICD-10-CM | POA: Diagnosis present

## 2020-03-25 LAB — CBC
HCT: 35.6 % — ABNORMAL LOW (ref 36.0–46.0)
Hemoglobin: 11.4 g/dL — ABNORMAL LOW (ref 12.0–15.0)
MCH: 27.3 pg (ref 26.0–34.0)
MCHC: 32 g/dL (ref 30.0–36.0)
MCV: 85.4 fL (ref 80.0–100.0)
Platelets: 217 10*3/uL (ref 150–400)
RBC: 4.17 MIL/uL (ref 3.87–5.11)
RDW: 15.3 % (ref 11.5–15.5)
WBC: 12 10*3/uL — ABNORMAL HIGH (ref 4.0–10.5)
nRBC: 0 % (ref 0.0–0.2)

## 2020-03-25 LAB — TYPE AND SCREEN
ABO/RH(D): O POS
Antibody Screen: NEGATIVE

## 2020-03-25 LAB — RESPIRATORY PANEL BY RT PCR (FLU A&B, COVID)
Influenza A by PCR: NEGATIVE
Influenza B by PCR: NEGATIVE
SARS Coronavirus 2 by RT PCR: NEGATIVE

## 2020-03-25 LAB — RPR: RPR Ser Ql: NONREACTIVE

## 2020-03-25 MED ORDER — OXYCODONE-ACETAMINOPHEN 5-325 MG PO TABS: 1.0000 | ORAL_TABLET | ORAL | Status: DC | PRN

## 2020-03-25 MED ORDER — SODIUM CHLORIDE 0.9% FLUSH: 3.0000 mL | INTRAVENOUS | Status: DC | PRN

## 2020-03-25 MED ORDER — FENTANYL CITRATE (PF) 100 MCG/2ML IJ SOLN: 50.0000 ug | INTRAMUSCULAR | Status: DC | PRN

## 2020-03-25 MED ORDER — ONDANSETRON HCL 4 MG/2ML IJ SOLN: 4.0000 mg | INTRAMUSCULAR | Status: DC | PRN

## 2020-03-25 MED ORDER — LACTATED RINGERS IV SOLN: INTRAVENOUS | Status: DC

## 2020-03-25 MED ORDER — SODIUM CHLORIDE 0.9 % IV SOLN: 250.0000 mL | INTRAVENOUS | Status: DC | PRN

## 2020-03-25 MED ORDER — DIBUCAINE (PERIANAL) 1 % EX OINT: 1.0000 "application " | TOPICAL_OINTMENT | CUTANEOUS | Status: DC | PRN

## 2020-03-25 MED ORDER — OXYTOCIN BOLUS FROM INFUSION: 333.0000 mL | Freq: Once | INTRAVENOUS | Status: DC

## 2020-03-25 MED ORDER — PHENYLEPHRINE 40 MCG/ML (10ML) SYRINGE FOR IV PUSH (FOR BLOOD PRESSURE SUPPORT): 80.0000 ug | PREFILLED_SYRINGE | INTRAVENOUS | Status: DC | PRN

## 2020-03-25 MED ORDER — FENTANYL-BUPIVACAINE-NACL 0.5-0.125-0.9 MG/250ML-% EP SOLN: 12.0000 mL/h | EPIDURAL | Status: DC | PRN

## 2020-03-25 MED ORDER — BUPIVACAINE HCL (PF) 0.75 % IJ SOLN
INTRAMUSCULAR | Status: DC | PRN
Start: 2020-03-25 — End: 2020-03-25
  Administered 2020-03-25: 12 mL/h via EPIDURAL

## 2020-03-25 MED ORDER — SODIUM CHLORIDE 0.9% FLUSH
3.0000 mL | Freq: Two times a day (BID) | INTRAVENOUS | Status: DC
  Administered 2020-03-25: 3 mL via INTRAVENOUS

## 2020-03-25 MED ORDER — OXYTOCIN-SODIUM CHLORIDE 30-0.9 UT/500ML-% IV SOLN
2.5000 [IU]/h | INTRAVENOUS | Status: DC
  Filled 2020-03-25: qty 500

## 2020-03-25 MED ORDER — LIDOCAINE HCL (PF) 1 % IJ SOLN
INTRAMUSCULAR | Status: DC | PRN
  Administered 2020-03-25: 6 mL via EPIDURAL

## 2020-03-25 MED ORDER — SOD CITRATE-CITRIC ACID 500-334 MG/5ML PO SOLN: 30.0000 mL | ORAL | Status: DC | PRN

## 2020-03-25 MED ORDER — PANTOPRAZOLE SODIUM 20 MG PO TBEC
20.0000 mg | DELAYED_RELEASE_TABLET | Freq: Every day | ORAL | Status: DC
  Administered 2020-03-25: 20 mg via ORAL
  Filled 2020-03-25: qty 1

## 2020-03-25 MED ORDER — DIPHENHYDRAMINE HCL 50 MG/ML IJ SOLN: 12.5000 mg | INTRAMUSCULAR | Status: DC | PRN

## 2020-03-25 MED ORDER — LACTATED RINGERS IV SOLN: 500.0000 mL | INTRAVENOUS | Status: DC | PRN

## 2020-03-25 MED ORDER — ACETAMINOPHEN 325 MG PO TABS: 650.0000 mg | ORAL_TABLET | ORAL | Status: DC | PRN

## 2020-03-25 MED ORDER — LIDOCAINE HCL (PF) 1 % IJ SOLN: 30.0000 mL | INTRAMUSCULAR | Status: DC | PRN

## 2020-03-25 MED ORDER — ACETAMINOPHEN 325 MG PO TABS
650.0000 mg | ORAL_TABLET | ORAL | Status: DC | PRN
  Administered 2020-03-26 (×2): 650 mg via ORAL
  Filled 2020-03-25 (×2): qty 2

## 2020-03-25 MED ORDER — PRENATAL MULTIVITAMIN CH
1.0000 | ORAL_TABLET | Freq: Every day | ORAL | Status: DC
  Administered 2020-03-27: 1 via ORAL
  Filled 2020-03-25 (×2): qty 1

## 2020-03-25 MED ORDER — SERTRALINE HCL 50 MG PO TABS
50.0000 mg | ORAL_TABLET | Freq: Every day | ORAL | Status: DC
  Administered 2020-03-25: 50 mg via ORAL
  Filled 2020-03-25: qty 1

## 2020-03-25 MED ORDER — DIPHENHYDRAMINE HCL 25 MG PO CAPS: 25.0000 mg | ORAL_CAPSULE | Freq: Four times a day (QID) | ORAL | Status: DC | PRN

## 2020-03-25 MED ORDER — IBUPROFEN 600 MG PO TABS
600.0000 mg | ORAL_TABLET | Freq: Four times a day (QID) | ORAL | Status: DC
  Administered 2020-03-25 – 2020-03-27 (×6): 600 mg via ORAL
  Filled 2020-03-25 (×6): qty 1

## 2020-03-25 MED ORDER — MEASLES, MUMPS & RUBELLA VAC IJ SOLR: 0.5000 mL | Freq: Once | INTRAMUSCULAR | Status: DC

## 2020-03-25 MED ORDER — EPHEDRINE 5 MG/ML INJ: 10.0000 mg | INTRAVENOUS | Status: DC | PRN

## 2020-03-25 MED ORDER — LACTATED RINGERS IV SOLN
500.0000 mL | Freq: Once | INTRAVENOUS | Status: AC
  Administered 2020-03-25: 500 mL via INTRAVENOUS

## 2020-03-25 MED ORDER — SENNOSIDES-DOCUSATE SODIUM 8.6-50 MG PO TABS
1.0000 | ORAL_TABLET | Freq: Two times a day (BID) | ORAL | Status: DC
  Administered 2020-03-25 – 2020-03-27 (×3): 1 via ORAL
  Filled 2020-03-25 (×3): qty 1

## 2020-03-25 MED ORDER — FLEET ENEMA 7-19 GM/118ML RE ENEM: 1.0000 | ENEMA | RECTAL | Status: DC | PRN

## 2020-03-25 MED ORDER — PHENYLEPHRINE 40 MCG/ML (10ML) SYRINGE FOR IV PUSH (FOR BLOOD PRESSURE SUPPORT)
PREFILLED_SYRINGE | INTRAVENOUS | Status: AC
  Filled 2020-03-25: qty 10

## 2020-03-25 MED ORDER — BENZOCAINE-MENTHOL 20-0.5 % EX AERO
1.0000 "application " | INHALATION_SPRAY | CUTANEOUS | Status: DC | PRN
  Administered 2020-03-25: 1 via TOPICAL
  Filled 2020-03-25: qty 56

## 2020-03-25 MED ORDER — ONDANSETRON HCL 4 MG/2ML IJ SOLN
4.0000 mg | Freq: Four times a day (QID) | INTRAMUSCULAR | Status: DC | PRN
  Administered 2020-03-25: 4 mg via INTRAVENOUS
  Filled 2020-03-25: qty 2

## 2020-03-25 MED ORDER — COCONUT OIL OIL: 1.0000 "application " | TOPICAL_OIL | Status: DC | PRN

## 2020-03-25 MED ORDER — WITCH HAZEL-GLYCERIN EX PADS: 1.0000 "application " | MEDICATED_PAD | CUTANEOUS | Status: DC | PRN

## 2020-03-25 MED ORDER — HYDROXYZINE HCL 25 MG PO TABS
25.0000 mg | ORAL_TABLET | Freq: Four times a day (QID) | ORAL | Status: DC | PRN
  Filled 2020-03-25: qty 1

## 2020-03-25 MED ORDER — FENTANYL-BUPIVACAINE-NACL 0.5-0.125-0.9 MG/250ML-% EP SOLN
EPIDURAL | Status: AC
  Filled 2020-03-25: qty 250

## 2020-03-25 MED ORDER — TETANUS-DIPHTH-ACELL PERTUSSIS 5-2.5-18.5 LF-MCG/0.5 IM SUSY: 0.5000 mL | PREFILLED_SYRINGE | Freq: Once | INTRAMUSCULAR | Status: DC

## 2020-03-25 MED ORDER — OXYCODONE-ACETAMINOPHEN 5-325 MG PO TABS: 2.0000 | ORAL_TABLET | ORAL | Status: DC | PRN

## 2020-03-25 MED ORDER — ONDANSETRON HCL 4 MG PO TABS: 4.0000 mg | ORAL_TABLET | ORAL | Status: DC | PRN

## 2020-03-25 NOTE — MAU Note (Signed)
Ctxs since 0142. Some bleeding noted. No dilation last sve. Had wanted TOLAC previously but now states she just wants a c/s

## 2020-03-25 NOTE — Anesthesia Procedure Notes (Signed)
Epidural Patient location during procedure: OB Start time: 03/25/2020 4:44 AM End time: 03/25/2020 4:50 AM  Staffing Anesthesiologist: Bethena Midget, MD  Preanesthetic Checklist Completed: patient identified, IV checked, site marked, risks and benefits discussed, surgical consent, monitors and equipment checked, pre-op evaluation and timeout performed  Epidural Patient position: sitting Prep: DuraPrep and site prepped and draped Patient monitoring: continuous pulse ox and blood pressure Approach: midline Location: L3-L4 Injection technique: LOR air  Needle:  Needle type: Tuohy  Needle gauge: 17 G Needle length: 9 cm and 9 Needle insertion depth: 5 cm cm Catheter type: closed end flexible Catheter size: 19 Gauge Catheter at skin depth: 10 cm Test dose: negative  Assessment Events: blood not aspirated, injection not painful, no injection resistance, no paresthesia and negative IV test

## 2020-03-25 NOTE — Progress Notes (Signed)
Patient ID: Vicki Ford, female   DOB: [birth year], 29 y.o.   MRN: [id number]  Mostly comfortable with epidural; has a little 'hot spot' but otherwise is much better; feels committed to [facility] process; states she had extensive counseling at her office visits and this is the plan she desires  BP 111/58, P 90 FHR 130-140s, +accels, no decels Ctx q 4 mins Cx (not rechecked since epid placed)  IUP@39 .6wks TOLAC Active labor  Plan to check cx in next 1-2 hrs  [name] [facility] 03/25/2020

## 2020-03-25 NOTE — Progress Notes (Signed)
Labor Progress Note  Subjective:  Doing well. Not feeling much pressure. Cannot feel contractions   Objective:  BP 108/61   Pulse 70   Temp 99 F (37.2 C) (Oral)   Resp 18   Ht 5\' 1"  (1.549 m)   Wt 93 kg   LMP 06/09/2019 (Exact Date)   SpO2 100%   BMI 38.73 kg/m    Cervical: 6.5/80/-2, middle, vertex Toco: regular q35minutes FH: BL 140, moderate var, + a, none decels.   Assessment and Plan:  Vicki Ford is a 29 y.o. 38 at [redacted]w[redacted]d admitted for SOL. TOLAC (hx of 35 wk twins).   Labor:  Expectant management. SROM 0640. Can consider pitocin if no cervical change. Toco q 4 minutes.  . Pain control: Epidural . Anticipated MOD: NSVD  Fetal Wellbeing: Category I . GBS Negative/-- (10/04 0917)  . Continuous fetal monitoring  11-28-2004, M.D.  FM PGY 3 03/25/2020 10:09 AM

## 2020-03-25 NOTE — Lactation Note (Signed)
This note was copied from a baby's chart. Lactation Consultation Note  Patient Name: Vicki Ford JHERD'E Date: 03/25/2020 Reason for consult: Initial assessment  Initial visit to 8 hours old infant of a P2 mother. Mother states trying to breastfeed briefly with her first pregnancy. Mother reports having twins and it was complicated to breastfeed or pump. Mother explains she is planning to breastfeed this infant.   Talked to mother about hand expression, demonstrated technique and able to collect ~40mL. Infant is showing hunger cues. Undressed him and set up pillows for football position to right breast. After a few attempts infant latched successfully. Noted breast tissue moving, suckling and swallowing. Mother denies discomfort or pain.     Reviewed with mother average size of a NB stomach.  Encourage to follow babies' hunger and fullness cues. Reviewed importance to offer the breast 8 to 12 times in a 24-hour period for proper stimulation and to establish good milk supply. Reviewed signs of good milk transfer. Promoted maternal rest, hydration and food intake. Reviewed newborn behavior and expectations with mother and encouraged to contact [facility] for support, questions or concerns.    All questions answered at this time. Infant is still breastfeeding upon departure. Provided manual pump.   Consult was done in [place].   Maternal Data Formula Feeding for Exclusion: No Has patient been taught Hand Expression?: Yes Does the patient have breastfeeding experience prior to this delivery?: Yes  Feeding Feeding Type: Breast Fed  LATCH Score Latch: Grasps breast easily, tongue down, lips flanged, rhythmical sucking.  Audible Swallowing: Spontaneous and intermittent  Type of Nipple: Everted at rest and after stimulation  Comfort (Breast/Nipple): Soft / non-tender  Hold (Positioning): Assistance needed to correctly position infant at breast and maintain latch.  LATCH Score:  9  Interventions Interventions: Breast feeding basics reviewed;Assisted with latch;Skin to skin;Breast massage;Hand express;Adjust position;Support pillows;Position options;Expressed milk;Hand pump  Lactation Tools Discussed/Used WIC Program: No   Consult Status Consult Status: Follow-up Date: 03/26/20 Follow-up type: In-patient    Elic Vencill A Higuera Ancidey 03/25/2020, 11:17 PM

## 2020-03-25 NOTE — Discharge Summary (Signed)
Postpartum Discharge Summary    Patient Name: Vicki Ford DOB: [birth year] MRN: [id number]  Date of admission: 03/25/2020 Delivery date:03/25/2020  Delivering provider: Zettie Cooley E  Date of discharge: 03/27/2020  Admitting diagnosis: Pregnancy [Z34.90] Intrauterine pregnancy: [redacted]w[redacted]d    Secondary diagnosis:  Active Problems:   Exercise-induced asthma   PTSD (post-traumatic stress disorder)   MDD (major depressive disorder), recurrent severe, without psychosis ([place]   Migraine   History of cesarean delivery   H/O tubal ligation  Additional problems: as noted above   Discharge diagnosis:  Term Delivery, VBAC                                         Post partum procedures:postpartum tubal ligation Augmentation: none Complications: None  Hospital course: Onset of Labor With Vaginal Delivery      29y.o. yo [id number] admitted in Latent Labor on 03/25/2020. Patient had an uncomplicated labor course as follows:  Membrane Rupture Time/Date: 6:39 AM ,03/25/2020   Delivery Method:VBAC, Spontaneous  Episiotomy: None  Lacerations:  1st degree;Sulcus;Periurethral  Patient had a postpartum course remarkable for a ppBTL on PPD#1 .  She is ambulating, tolerating a regular diet, passing flatus, and urinating well. She had a SW consult while inpatient and there were no barriers to discharge. Patient is discharged home in stable condition on 03/27/20.  Newborn Data: Birth date:03/25/2020  Birth time:3:15 PM  Gender:Female  Living status:Living  Apgars:8 ,9  Weight:2955 g (6lb 8.2oz)  Magnesium Sulfate received: No BMZ received: No Rhophylac:N/A MMR:N/A T-DaP: declined Flu: declined Transfusion:No  Physical exam  Vitals:   03/26/20 1904 03/26/20 2209 03/26/20 2341 03/27/20 0400  BP: 123/72 121/68 127/78 120/74  Pulse: 71 79 78 63  Resp: '16 18 18 16  ' Temp: 98.4 F (36.9 C) 97.7 F (36.5 C) 98.4 F (36.9 C) 97.8 F (36.6 C)  TempSrc: Oral Oral Oral Oral  SpO2: 99%  100% 97% 97%  Weight:      Height:       General: alert and cooperative Lochia: appropriate Uterine Fundus: firm Incision: honeycomb on umbilical incision- dry and intact DVT Evaluation: No evidence of DVT seen on physical exam. Labs: Lab Results  Component Value Date   WBC 12.0 (H) 03/25/2020   HGB 11.4 (L) 03/25/2020   HCT 35.6 (L) 03/25/2020   MCV 85.4 03/25/2020   PLT 217 03/25/2020   CMP Latest Ref Rng & Units 03/11/2020  Glucose 70 - 99 mg/dL 92  BUN 6 - 20 mg/dL 8  Creatinine 0.44 - 1.00 mg/dL 0.74  Sodium 135 - 145 mmol/L 135  Potassium 3.5 - 5.1 mmol/L 3.9  Chloride 98 - 111 mmol/L 104  CO2 22 - 32 mmol/L 19(L)  Calcium 8.9 - 10.3 mg/dL 9.4   Edinburgh Score: Edinburgh Postnatal Depression Scale Screening Tool 03/25/2020  I have been able to laugh and see the funny side of things. 0  I have looked forward with enjoyment to things. 1  I have blamed myself unnecessarily when things went wrong. 3  I have been anxious or worried for no good reason. 3  I have felt scared or panicky for no good reason. 3  Things have been getting on top of me. 2  I have been so unhappy that I have had difficulty sleeping. 2  I have felt sad or miserable. 1  I have  been so unhappy that I have been crying. 1  The thought of harming myself has occurred to me. 0  Edinburgh Postnatal Depression Scale Total 16     After visit meds:  Allergies as of 03/27/2020      Reactions   Liver Swelling      Medication List    STOP taking these medications   cyclobenzaprine 5 MG tablet Commonly known as: FLEXERIL   folic acid 935 MCG tablet Commonly known as: FOLVITE   lansoprazole 15 MG capsule Commonly known as: Prevacid   ondansetron 4 MG disintegrating tablet Commonly known as: Zofran ODT     TAKE these medications   hydrOXYzine 25 MG tablet Commonly known as: ATARAX/VISTARIL Take 1 tablet (25 mg total) by mouth every 6 (six) hours as needed for itching. What changed: Another  medication with the same name was removed. Continue taking this medication, and follow the directions you see here.   ibuprofen 600 MG tablet Commonly known as: ADVIL Take 1 tablet (600 mg total) by mouth every 6 (six) hours as needed.   oxyCODONE 5 MG immediate release tablet Commonly known as: Oxy IR/ROXICODONE Take 1 tablet (5 mg total) by mouth every 4 (four) hours as needed for severe pain or breakthrough pain.   prenatal multivitamin Tabs tablet Take 1 tablet by mouth daily at 12 noon.   sertraline 50 MG tablet Commonly known as: Zoloft Take 1 tablet (50 mg total) by mouth daily.   vitamin C with rose hips 1000 MG tablet Take 1,000 mg by mouth daily.        Discharge home in stable condition Infant Feeding: Breast Infant Disposition:home with mother Discharge instruction: per After Visit Summary and Postpartum booklet. Activity: Advance as tolerated. Pelvic rest for 6 weeks.  Diet: routine diet Future Appointments: Future Appointments  Date Time Provider [place]  04/29/2020 10:00 AM Truett Mainland, DO [facility] None   Follow up Visit: Message sent to [facility] clinic on 03/25/20  Please schedule this patient for a In person postpartum visit in 4 weeks with the following provider: Any provider. Additional Postpartum F/U:none  High risk pregnancy complicated by: h/o Cesarean x1 Delivery mode:  VBAC, Spontaneous  Anticipated Birth Control:  BTL done PP  03/27/2020 Myrtis Ser, CNM  9:14 AM

## 2020-03-25 NOTE — H&P (Addendum)
OBSTETRIC ADMISSION HISTORY AND PHYSICAL  Vicki Ford is a 29 y.o. female [phone] with IUP at [redacted]w[redacted]d by 8 wk [place] presenting for SOL. She reports +FMs, No LOF, no VB, no blurry vision, headaches or peripheral edema, and RUQ pain.  She plans on breast feeding. She requests PP BTL for birth control. She received her prenatal care at [facility]  Dating: By 8 wk [place] --->  Estimated Date of Delivery: 03/26/20  Sono:  @[redacted]w[redacted]d , CWD, normal anatomy, cephalic presentation, posterior placenta, 311g, 87% EFW   Prenatal History/Complications:  TOLAC - H/o previous CS with twins GERD during pregnancy PUPP during pregnancy Exercise-induced asthma Major depressive disorder, anxiety, PTSD  Past Medical History: Past Medical History:  Diagnosis Date  . Anxiety    takes zoloft, buspar, & atarax  . Asthma    sports induced asthma  . Depression    takes zoloft & buspar  . Headache   . Preterm labor     Past Surgical History: Past Surgical History:  Procedure Laterality Date  . CESAREAN SECTION    . GALLBLADDER SURGERY  01/15/2019  . HERNIA REPAIR      Obstetrical History: OB History    Gravida  3   Para  1   Term  0   Preterm  1   AB  1   Living  2     SAB  1   TAB  0   Ectopic  0   Multiple  1   Live Births  2           Social History Social History   Socioeconomic History  . Marital status: Married    Spouse name: Not on file  . Number of children: Not on file  . Years of education: Not on file  . Highest education level: Not on file  Occupational History  . Not on file  Tobacco Use  . Smoking status: Former Smoker    Quit date: 03/30/2019    Years since quitting: 0.9  . Smokeless tobacco: Never Used  . Tobacco comment: not since pregnancy  Vaping Use  . Vaping Use: Former  Substance and Sexual Activity  . Alcohol use: Not Currently  . Drug use: Not Currently    Comment: as a teen  . Sexual activity: Yes  Other Topics Concern  . Not on file  Social  History Narrative  . Not on file   Social Determinants of Health   Financial Resource Strain:   . Difficulty of Paying Living Expenses: Not on file  Food Insecurity:   . Worried About 13/05/2018 in the Last Year: Not on file  . Ran Out of Food in the Last Year: Not on file  Transportation Needs:   . Lack of Transportation (Medical): Not on file  . Lack of Transportation (Non-Medical): Not on file  Physical Activity:   . Days of Exercise per Week: Not on file  . Minutes of Exercise per Session: Not on file  Stress:   . Feeling of Stress : Not on file  Social Connections:   . Frequency of Communication with Friends and Family: Not on file  . Frequency of Social Gatherings with Friends and Family: Not on file  . Attends Religious Services: Not on file  . Active Member of Clubs or Organizations: Not on file  . Attends Programme researcher, broadcasting/film/video Meetings: Not on file  . Marital Status: Not on file    Family History: Family History  Problem  Relation Age of Onset  . Cancer Mother   . Depression Mother   . ADD / ADHD Sister   . Depression Sister     Allergies: No Known Allergies  Medications Prior to Admission  Medication Sig Dispense Refill Last Dose  . Ascorbic Acid (VITAMIN C WITH ROSE HIPS) 1000 MG tablet Take 1,000 mg by mouth daily.     . cyclobenzaprine (FLEXERIL) 5 MG tablet Take 1 tablet (5 mg total) by mouth 3 (three) times daily as needed for muscle spasms. (Patient not taking: Reported on 03/15/2020) 30 tablet 0   . folic acid (FOLVITE) 800 MCG tablet Take 400 mcg by mouth daily. (Patient not taking: Reported on 11/28/2019)     . hydrOXYzine (ATARAX/VISTARIL) 25 MG tablet Take 25 mg by mouth 3 (three) times daily as needed. (Patient not taking: Reported on 12/11/2019)     . hydrOXYzine (ATARAX/VISTARIL) 25 MG tablet Take 1 tablet (25 mg total) by mouth every 6 (six) hours as needed for itching. (Patient not taking: Reported on 03/15/2020) 30 tablet 2   . lansoprazole  (PREVACID) 15 MG capsule Take 1 capsule (15 mg total) by mouth daily at 12 noon. 60 capsule 2   . ondansetron (ZOFRAN ODT) 4 MG disintegrating tablet Take 1 tablet (4 mg total) by mouth every 6 (six) hours as needed for nausea. (Patient not taking: Reported on 03/15/2020) 20 tablet 0   . Prenatal Vit-Fe Fumarate-FA (PRENATAL MULTIVITAMIN) TABS tablet Take 1 tablet by mouth daily at 12 noon. (Patient not taking: Reported on 11/28/2019)     . sertraline (ZOLOFT) 50 MG tablet Take 1 tablet (50 mg total) by mouth daily. 30 tablet 3      Review of Systems   All systems reviewed and negative except as stated in HPI  Blood pressure 116/80, pulse 68, resp. rate 20, height 5\' 1"  (1.549 m), weight 93 kg, last menstrual period 06/09/2019, SpO2 100 %, unknown if currently breastfeeding. General appearance: alert, cooperative, appears stated age and no distress Lungs: normal respiratory effort, no distress Heart: regular rate Abdomen: soft, non-tender Presentation: cephalic Fetal monitoringBaseline: 140 bpm, Variability: Good {> 6 bpm), Accelerations: Reactive and Decelerations: Absent Uterine activityFrequency: Every 3-5 minutes Dilation: 5.5 Effacement (%): 90 Exam by:: morris,CNM    Prenatal labs: ABO, Rh: O/Positive/-- (03/23 04-16-1982) Antibody: Negative (03/23 0952) Rubella: 6.37 (03/23 0952) RPR: Non Reactive (08/16 0904)  HBsAg: Negative (03/23 0952)  HIV: Non Reactive (08/16 0904)  GBS: Negative/-- (10/04 0917)  2 hr Glucola passed, gluc 131 Genetic screening: NIPS low risk, AFP neg Anatomy 11-28-2004 wnl   Prenatal Transfer Tool  Maternal Diabetes: No Genetic Screening: Normal Maternal Ultrasounds/Referrals: Normal Fetal Ultrasounds or other Referrals:  Referred to Materal Fetal Medicine  Maternal Substance Abuse:  No Significant Maternal Medications:  Meds include: Zoloft Protonix Other: Vistaril prn Significant Maternal Lab Results: Group B Strep negative  No results found for this or  any previous visit (from the past 24 hour(s)).  Patient Active Problem List   Diagnosis Date Noted  . History of cesarean delivery 10/13/2019  . Gastroesophageal reflux during pregnancy, antepartum 10/13/2019  . PUPP (pruritic urticarial papules and plaques of pregnancy) 10/13/2019  . Supervision of other normal pregnancy, antepartum 08/19/2019  . Twin liveborn by cesarean 08/19/2019  . PTSD (post-traumatic stress disorder) 07/18/2017  . MDD (major depressive disorder), recurrent severe, without psychosis (HCC) 12/26/2013  . Sciatica 12/26/2013  . Attention or concentration deficit 02/08/2010  . Migraine 02/08/2010  . Allergic rhinitis  07/22/2008  . Exercise-induced asthma 07/22/2008    Assessment/Plan:  Vicki Ford is a 29 y.o. [id number] at [redacted]w[redacted]d here for SOL  #Labor: SOL, currently progressing well. Expectant management at this time.  #Pain: Epidural #FWB: Cat 1 #ID:  n/a #MOF: breast #MOC: PP BTL #Circ:  Desired -------------------- #TOLAC #GERD #PUPP during pregnancy  #Exercise-induced asthma #Major depressive disorder, anxiety, PTSD: will place postpartum social work consult  Fayette Pho, MD  03/25/2020, 3:56 AM  CNM attestation:  I have seen and examined this patient; I agree with above documentation in the resident's note.   Vicki Ford is a 29 y.o. [phone] here for SOL and desires TOLAC  PE: BP (!) 111/58   Pulse 90   Temp 99.1 F (37.3 C) (Oral)   Resp 17   Ht 5\' 1"  (1.549 m)   Wt 93 kg   LMP 06/09/2019 (Exact Date)   SpO2 100%   BMI 38.73 kg/m  Gen: very uncomfortable, breathing w ctx Resp: normal effort, no distress Abd: gravid  ROS, labs, PMH reviewed  Plan: -Admit to Labor & Delivery -Discussion with patient re plan for TOLAC; initially she stated that she 'wants the baby out!', but upon further investigation she feels that once she has an epidural placed she will be doing better; will plan to place epidural and then revisit the topic to  make sure she is still wanting to TOLAC -Desires ppBTL  08/07/2019 CNM 03/25/2020, 7:23 AM

## 2020-03-25 NOTE — Anesthesia Preprocedure Evaluation (Addendum)
Anesthesia Evaluation  Patient identified by MRN, date of birth, ID band Patient awake    Reviewed: Allergy & Precautions, H&P , NPO status , Patient's Chart, lab work & pertinent test results, reviewed documented beta blocker date and time   Airway Mallampati: II  TM Distance: >3 FB Neck ROM: full    Dental no notable dental hx. (+) Teeth Intact, Dental Advisory Given   Pulmonary asthma , former smoker,    Pulmonary exam normal breath sounds clear to auscultation       Cardiovascular negative cardio ROS Normal cardiovascular exam Rhythm:regular Rate:Normal     Neuro/Psych  Headaches, Anxiety Depression negative psych ROS   GI/Hepatic negative GI ROS, Neg liver ROS,   Endo/Other  negative endocrine ROS  Renal/GU negative Renal ROS  negative genitourinary   Musculoskeletal   Abdominal   Peds  Hematology negative hematology ROS (+)   Anesthesia Other Findings   Reproductive/Obstetrics (+) Pregnancy                             Anesthesia Physical Anesthesia Plan  ASA: III  Anesthesia Plan: Epidural   Post-op Pain Management:    Induction:   PONV Risk Score and Plan:   Airway Management Planned: Natural Airway  Additional Equipment:   Intra-op Plan:   Post-operative Plan:   Informed Consent: I have reviewed the patients History and Physical, chart, labs and discussed the procedure including the risks, benefits and alternatives for the proposed anesthesia with the patient or authorized representative who has indicated his/her understanding and acceptance.     Dental Advisory Given  Plan Discussed with: Anesthesiologist  Anesthesia Plan Comments: (Labs checked- platelets confirmed with RN in room. Fetal heart tracing, per RN, reported to be stable enough for sitting procedure. Discussed epidural, and patient consents to the procedure:  included risk of possible  headache,backache, failed block, allergic reaction, and nerve injury. This patient was asked if she had any questions or concerns before the procedure started.)       Anesthesia Quick Evaluation

## 2020-03-26 ENCOUNTER — Encounter (HOSPITAL_COMMUNITY)
Admission: AD | Disposition: A | Discharge status: Home / Self Care | Payer: Self-pay | Source: Home / Self Care | Attending: Obstetrics and Gynecology

## 2020-03-26 ENCOUNTER — Inpatient Hospital Stay (HOSPITAL_COMMUNITY): Payer: BC Managed Care – PPO | Admitting: Anesthesiology

## 2020-03-26 ENCOUNTER — Encounter (HOSPITAL_COMMUNITY): Payer: Self-pay | Admitting: Obstetrics & Gynecology

## 2020-03-26 DIAGNOSIS — Z9851 Tubal ligation status: Secondary | ICD-10-CM

## 2020-03-26 DIAGNOSIS — Z302 Encounter for sterilization: Secondary | ICD-10-CM

## 2020-03-26 HISTORY — PX: TUBAL LIGATION: SHX77

## 2020-03-26 SURGERY — LIGATION, FALLOPIAN TUBE, POSTPARTUM
Anesthesia: Epidural

## 2020-03-26 MED ORDER — FENTANYL CITRATE (PF) 100 MCG/2ML IJ SOLN
INTRAMUSCULAR | Status: DC | PRN
  Administered 2020-03-26 (×2): 50 ug via INTRAVENOUS

## 2020-03-26 MED ORDER — ONDANSETRON HCL 4 MG/2ML IJ SOLN
INTRAMUSCULAR | Status: AC
  Filled 2020-03-26: qty 2

## 2020-03-26 MED ORDER — LACTATED RINGERS IV SOLN: INTRAVENOUS | Status: DC | PRN

## 2020-03-26 MED ORDER — METOCLOPRAMIDE HCL 10 MG PO TABS
10.0000 mg | ORAL_TABLET | Freq: Once | ORAL | Status: AC
  Administered 2020-03-26: 10 mg via ORAL
  Filled 2020-03-26: qty 1

## 2020-03-26 MED ORDER — LIDOCAINE HCL (PF) 2 % IJ SOLN
INTRAMUSCULAR | Status: AC
  Filled 2020-03-26: qty 5

## 2020-03-26 MED ORDER — BUPIVACAINE HCL (PF) 0.25 % IJ SOLN
INTRAMUSCULAR | Status: AC
  Filled 2020-03-26: qty 30

## 2020-03-26 MED ORDER — FENTANYL CITRATE (PF) 100 MCG/2ML IJ SOLN
INTRAMUSCULAR | Status: AC
  Filled 2020-03-26: qty 2

## 2020-03-26 MED ORDER — SERTRALINE HCL 50 MG PO TABS
50.0000 mg | ORAL_TABLET | Freq: Every day | ORAL | Status: DC
  Administered 2020-03-26 – 2020-03-27 (×2): 50 mg via ORAL
  Filled 2020-03-26 (×2): qty 1

## 2020-03-26 MED ORDER — EPINEPHRINE PF 1 MG/ML IJ SOLN
INTRAMUSCULAR | Status: AC
  Filled 2020-03-26: qty 1

## 2020-03-26 MED ORDER — MIDAZOLAM HCL 2 MG/2ML IJ SOLN
INTRAMUSCULAR | Status: AC
  Filled 2020-03-26: qty 2

## 2020-03-26 MED ORDER — MIDAZOLAM HCL 5 MG/5ML IJ SOLN
INTRAMUSCULAR | Status: DC | PRN
  Administered 2020-03-26: 2 mg via INTRAVENOUS

## 2020-03-26 MED ORDER — LIDOCAINE HCL (PF) 2 % IJ SOLN
INTRAMUSCULAR | Status: AC
  Filled 2020-03-26: qty 20

## 2020-03-26 MED ORDER — LACTATED RINGERS IV SOLN: INTRAVENOUS | Status: DC

## 2020-03-26 MED ORDER — LIDOCAINE-EPINEPHRINE (PF) 2 %-1:200000 IJ SOLN
INTRAMUSCULAR | Status: DC | PRN
  Administered 2020-03-26: 3 mL via INTRADERMAL
  Administered 2020-03-26: 7 mL via INTRADERMAL
  Administered 2020-03-26: 3 mL via INTRADERMAL
  Administered 2020-03-26: 10 mL via INTRADERMAL

## 2020-03-26 MED ORDER — BUPIVACAINE HCL (PF) 0.25 % IJ SOLN
INTRAMUSCULAR | Status: DC | PRN
  Administered 2020-03-26: 30 mL

## 2020-03-26 MED ORDER — OXYCODONE HCL 5 MG PO TABS: 5.0000 mg | ORAL_TABLET | ORAL | Status: DC | PRN

## 2020-03-26 MED ORDER — SODIUM CHLORIDE 0.9 % IR SOLN
Status: DC | PRN
  Administered 2020-03-26: 1

## 2020-03-26 MED ORDER — ONDANSETRON HCL 4 MG/2ML IJ SOLN
INTRAMUSCULAR | Status: DC | PRN
  Administered 2020-03-26: 4 mg via INTRAVENOUS

## 2020-03-26 MED ORDER — FAMOTIDINE 20 MG PO TABS
40.0000 mg | ORAL_TABLET | Freq: Once | ORAL | Status: AC
  Administered 2020-03-26: 40 mg via ORAL
  Filled 2020-03-26: qty 2

## 2020-03-26 SURGICAL SUPPLY — 22 items
BLADE SURG 11 STRL SS (BLADE) ×6 IMPLANT
CLIP FILSHIE TUBAL LIGA STRL (Clip) ×6 IMPLANT
DECANTER SPIKE VIAL GLASS SM (MISCELLANEOUS) ×3 IMPLANT
DRSG OPSITE POSTOP 3X4 (GAUZE/BANDAGES/DRESSINGS) ×3 IMPLANT
DURAPREP 26ML APPLICATOR (WOUND CARE) ×3 IMPLANT
GLOVE BIOGEL PI IND STRL 7.0 (GLOVE) ×1 IMPLANT
GLOVE BIOGEL PI IND STRL 7.5 (GLOVE) ×1 IMPLANT
GLOVE BIOGEL PI INDICATOR 7.0 (GLOVE) ×2
GLOVE BIOGEL PI INDICATOR 7.5 (GLOVE) ×2
GLOVE ECLIPSE 7.5 STRL STRAW (GLOVE) ×3 IMPLANT
GOWN STRL REUS W/TWL LRG LVL3 (GOWN DISPOSABLE) ×6 IMPLANT
HIBICLENS CHG 4% 4OZ BTL (MISCELLANEOUS) ×3 IMPLANT
NEEDLE HYPO 22GX1.5 SAFETY (NEEDLE) ×3 IMPLANT
NS IRRIG 1000ML POUR BTL (IV SOLUTION) ×3 IMPLANT
PACK ABDOMINAL MINOR (CUSTOM PROCEDURE TRAY) ×3 IMPLANT
PROTECTOR NERVE ULNAR (MISCELLANEOUS) ×3 IMPLANT
SPONGE LAP 4X18 RFD (DISPOSABLE) IMPLANT
SUT VICRYL 0 UR6 27IN ABS (SUTURE) ×3 IMPLANT
SUT VICRYL 4-0 PS2 18IN ABS (SUTURE) ×3 IMPLANT
SYR CONTROL 10ML LL (SYRINGE) ×3 IMPLANT
TOWEL OR 17X24 6PK STRL BLUE (TOWEL DISPOSABLE) ×6 IMPLANT
TRAY FOLEY W/BAG SLVR 14FR (SET/KITS/TRAYS/PACK) ×3 IMPLANT

## 2020-03-26 NOTE — Social Work (Signed)
CSW received consult for hx of PTSD, Anxiety and Depression.  MOB also scored a 15 on the [place]. CSW met with MOB to offer support and complete assessment.     CSW met with MOB and introduced self and role. CSW observed FOB bedside and MOB breast feeding baby. CSW politely asked to speak with MOB privately, FOB agreed. CSW informed MOB of reason for consult. MOB expressed understanding and disclosed she does have a history of ptsd, depression and anxiety. MOB stated she was diagnosed within the last four years. MOB stated she was restarted on Zoloft during pregnancy in order to treat and be proactive against PPD, which she experienced with her previous children. CSW informed MOB she scored a 15 on the [place] and asked how she is currently feeling. MOB stated she is currently feeling good and expressed she had some anxiety. MOB expressed the Zoloft is helpful with managing symptoms. MOB stated she has previously been involved in a 30-day therapy that was somewhat helpful. MOB expressed she uses cooking, cleaning, music and reading to also help cope with feelings of anxiety and depression. MOB denied any current SI or HI. MOB stated she is currently not involved in a DV relationship. MOB identified her sister, FOB family and friends as supports.  CSW provided education regarding the baby blues period vs. perinatal mood disorders, discussed treatment and gave resources for mental health follow up if concerns arise.  CSW recommends self-evaluation during the postpartum time period using the New Mom Checklist from Postpartum Progress and encouraged MOB to contact a medical professional if symptoms are noted at any time.      CSW provided review of Sudden Infant Death Syndrome (SIDS) precautions.  MOB stated baby will sleep in a bassinet. MOB expressed they have all of the essential needs for baby to discharge, including a new carseat. Baby will receive follow-up care at [facility]. MOB denies any  transportation barriers.   CSW identifies no further need for intervention and no barriers to discharge at this time.  Darra Lis, MSW, [employer] [phone]

## 2020-03-26 NOTE — Op Note (Signed)
Vicki Ford  03/25/2020 - 03/26/2020  PREOPERATIVE DIAGNOSIS:  Multiparity, undesired fertility  POSTOPERATIVE DIAGNOSIS:  Multiparity, undesired fertility  PROCEDURE:  Postpartum Bilateral Tubal Sterilization using Filshie Clips  ANESTHESIA:  Epidural and local analgesia using 0.25% Marcaine  COMPLICATIONS:  None immediate.  ESTIMATED BLOOD LOSS: 10 ml.  INDICATIONS: 29 y.o. [id number]  with undesired fertility,status post vaginal delivery, desires permanent sterilization.  Other reversible forms of contraception were discussed with patient; she declines all other modalities. Risks of procedure discussed with patient including but not limited to: risk of regret, permanence of method, bleeding, infection, injury to surrounding organs and need for additional procedures.  Failure risk of 0.5-1% with increased risk of ectopic gestation if pregnancy occurs was also discussed with patient.     FINDINGS:  Normal uterus, tubes, and ovaries.  PROCEDURE DETAILS: The patient was taken to the operating room where her epidural anesthesia was dosed up to surgical level and found to be adequate.  She was then placed in a supine position and prepped and draped in the usual sterile fashion.  After an adequate timeout was performed, attention was turned to the patient's abdomen where a small transverse skin incision was made under the umbilical fold. The incision was taken down to the layer of fascia using the scalpel, and fascia was incised, and extended bilaterally. The peritoneum was entered in a sharp fashion. The patient was placed in Trendelenburg.    A moist lap pad was used to move omentum and bowel away until the left fallopian tube was identified and grasped with a Babcock clamp, and followed out to the fimbriated end. The right Fallopian tube was identified by tracing out to the fimbraie, grasped with the Babcock clamps. An avascular midsection of the tube approximately 3-4cm from the cornua was  grasped with the babcock clamps and the filshie clip was applied, taking care to incorporate the entire tube.  Attention was then turned to the left fallopian tube after confirmation by tracing the tube out to the fimbriae. The same procedure was then performed on the left Fallopian tube, with excellent hemostasis was noted from both BTL sites.  Good hemostasis was noted overall.  The instruments were then removed from the patient's abdomen and the fascial incision was repaired with 0 Monocryl, and the skin was closed with a 4-0 Monocryl subcuticular stitch. Local analgesia was injected into the incision site. The patient tolerated the procedure well.  Sponge, lap, and needle counts were correct times two.  The patient was then taken to the recovery room awake and in stable condition.  Sheila Oats MD 03/26/2020 5:44 PM

## 2020-03-26 NOTE — Progress Notes (Addendum)
POSTPARTUM PROGRESS NOTE PPD#1  Subjective: Vicki Ford is a pleasant 29 y.o. [id number] s/p SVD at [redacted]w[redacted]d.  She reports she doing well. No acute events overnight but reports she has not gotten much sleep. She denies any problems with ambulating, voiding or po intake. Denies nausea or vomiting. She has passed flatus. Pain is well controlled.  Lochia is still frank blood, patient reports having to change her pad 3 times overnight.  Objective: Blood pressure 119/68, pulse 75, temperature 98.3 F (36.8 C), temperature source Oral, resp. rate 18, height 5\' 1"  (1.549 m), weight 93 kg, last menstrual period 06/09/2019, SpO2 99 %, unknown if currently breastfeeding.  Physical Exam:  General: alert, cooperative and no distress Chest: no respiratory distress Abdomen: soft, non-tender  Uterine Fundus: firm and at level of umbilicus Extremities: No calf swelling or tenderness  no edema  Recent Labs    03/25/20 0407  HGB 11.4*  HCT 35.6*    Assessment/Plan: Vicki Ford is a 29 y.o. 38 s/p SVD at [redacted]w[redacted]d.  Routine Postpartum Care: Doing well, pain well-controlled.  -- Continue routine care, lactation support  -- Contraception: Pt NPO this AM given plan for BTL today with Dr. [redacted]w[redacted]d at 16:55. -- Feeding: Breast  -Circumcision order placed  -PP SW consult needed due to hx of MDD, GAD, and PTSD   Dispo: Plan for discharge PPD#2.  Adrian Blackwater, Student-PA 03/26/2020 7:06 AM  Attestation of Supervision of Student:  I confirm that I have verified the information documented in the  physician assistant student's  note and that I have also personally reperformed the history, physical exam and all medical decision making activities.  I have verified that all services and findings are accurately documented in this student's note; and I agree with management and plan as outlined in the documentation. I have also made any necessary editorial changes.  03/28/2020, MD Center for [facility], [facility] 03/26/2020 6:53 PM

## 2020-03-26 NOTE — Lactation Note (Signed)
This note was copied from a baby's chart. Lactation Consultation Note  Patient Name: Vicki Ford [id number] Date: 03/26/2020  Baby Vicki Laye now 25 hours old.  Mom reports she has started donor milk because she feels like infant is not getting enough.  Urged mom to massage/hand express and pump past breastfeeding.  Urged her to call lactation as needed.  Maternal Data    Feeding Feeding Type: Donor Breast Milk  LATCH Score                   Interventions    Lactation Tools Discussed/Used     Consult Status      Vicki Ford 03/26/2020, 8:01 PM

## 2020-03-26 NOTE — Progress Notes (Signed)
RN called OB Anesthesiologist to clarify NPO times. Was instructed to let pt eat a light breakfast before 0800 & then remain NPO until procedure. RN will report off to oncoming shift.  Herbert Moors, RN

## 2020-03-26 NOTE — Anesthesia Preprocedure Evaluation (Signed)
Anesthesia Evaluation  Patient identified by MRN, date of birth, ID band Patient awake    Reviewed: Allergy & Precautions, H&P , NPO status , Patient's Chart, lab work & pertinent test results, reviewed documented beta blocker date and time   Airway Mallampati: II  TM Distance: >3 FB Neck ROM: full    Dental no notable dental hx. (+) Teeth Intact   Pulmonary asthma , former smoker,    Pulmonary exam normal breath sounds clear to auscultation       Cardiovascular negative cardio ROS Normal cardiovascular exam Rhythm:regular Rate:Normal     Neuro/Psych  Headaches, PSYCHIATRIC DISORDERS Anxiety Depression  Neuromuscular disease (sciatica)    GI/Hepatic Neg liver ROS, GERD  ,  Endo/Other  negative endocrine ROS  Renal/GU negative Renal ROS  negative genitourinary   Musculoskeletal   Abdominal   Peds  Hematology negative hematology ROS (+)   Anesthesia Other Findings   Reproductive/Obstetrics postpartum                             Anesthesia Physical  Anesthesia Plan  ASA: II  Anesthesia Plan: Epidural   Post-op Pain Management:    Induction:   PONV Risk Score and Plan:   Airway Management Planned: Natural Airway  Additional Equipment:   Intra-op Plan:   Post-operative Plan:   Informed Consent: I have reviewed the patients History and Physical, chart, labs and discussed the procedure including the risks, benefits and alternatives for the proposed anesthesia with the patient or authorized representative who has indicated his/her understanding and acceptance.     Dental Advisory Given  Plan Discussed with: Anesthesiologist and CRNA  Anesthesia Plan Comments: (Plan to dose existing epidural catheter for surgical anesthesia. Spinal as backup plan. )        Anesthesia Quick Evaluation

## 2020-03-26 NOTE — Anesthesia Postprocedure Evaluation (Signed)
Anesthesia Post Note  Patient: Vicki Ford  Procedure(s) Performed: AN AD HOC LABOR EPIDURAL     Patient location during evaluation: Mother Baby Anesthesia Type: Epidural Level of consciousness: awake and alert Pain management: pain level controlled Vital Signs Assessment: post-procedure vital signs reviewed and stable Respiratory status: spontaneous breathing, nonlabored ventilation and respiratory function stable Cardiovascular status: stable Postop Assessment: no headache, no backache and epidural receding Anesthetic complications: no   No complications documented.  Last Vitals:  Vitals:   03/25/20 2330 03/26/20 0300  BP: (!) 126/52 119/68  Pulse: 61 75  Resp: 18 18  Temp: 36.8 C 36.8 C  SpO2: 98% 99%    Last Pain:  Vitals:   03/26/20 0525  TempSrc:   PainSc: 3    Pain Goal: Patients Stated Pain Goal: 1 (03/25/20 0530)                 Trellis Paganini

## 2020-03-27 ENCOUNTER — Ambulatory Visit: Payer: Self-pay

## 2020-03-27 MED ORDER — SODIUM CHLORIDE 0.9% IV SOLUTION: Freq: Once | INTRAVENOUS | Status: DC

## 2020-03-27 MED ORDER — IBUPROFEN 600 MG PO TABS: 600.0000 mg | ORAL_TABLET | Freq: Four times a day (QID) | ORAL | 0 refills | Status: DC | PRN

## 2020-03-27 MED ORDER — OXYCODONE HCL 5 MG PO TABS: 5.0000 mg | ORAL_TABLET | ORAL | 0 refills | Status: DC | PRN

## 2020-03-27 NOTE — Anesthesia Postprocedure Evaluation (Signed)
Anesthesia Post Note  Patient: Vicki Ford  Procedure(s) Performed: POST PARTUM TUBAL LIGATION (N/A )     Patient location during evaluation: PACU Anesthesia Type: Epidural Level of consciousness: awake and alert Pain management: pain level controlled Vital Signs Assessment: post-procedure vital signs reviewed and stable Respiratory status: spontaneous breathing, nonlabored ventilation and respiratory function stable Cardiovascular status: stable Postop Assessment: no headache, no backache and epidural receding Anesthetic complications: no   No complications documented.  Last Vitals:  Vitals:   03/26/20 2341 03/27/20 0400  BP: 127/78 120/74  Pulse: 78 63  Resp: 18 16  Temp: 36.9 C 36.6 C  SpO2: 97% 97%    Last Pain:  Vitals:   03/27/20 0646  TempSrc:   PainSc: 5    Pain Goal: Patients Stated Pain Goal: 1 (03/25/20 0530)                 Mellody Dance

## 2020-03-27 NOTE — Lactation Note (Signed)
This note was copied from a baby's chart. Lactation Consultation Note  Patient Name: Vicki Ford [id number] Date: 03/27/2020   Mom repots being d/c.  Denies lead for lactation as this time. Urged to feed on cue eed on cue and 8-12 times day or more. Mom has [facility] consultation Breastffeeding aservices handou Urged to call as neededt Maternal Data    Feeding    LATCH Score                   Interventions    Lactation Tools Discussed/Used     Consult Status      [name] 03/27/2020, 5:58 PM

## 2020-03-27 NOTE — Discharge Instructions (Signed)
Laparoscopic Tubal Ligation, Care After °This sheet gives you information about how to care for yourself after your procedure. Your health care provider may also give you more specific instructions. If you have problems or questions, contact your health care provider. °What can I expect after the procedure? °After the procedure, it is common to have: °· A sore throat. °· Discomfort in your shoulder. °· Mild discomfort or cramping in your abdomen. °· Gas pains. °· Pain or soreness in the area where the surgical incision was made. °· A bloated feeling. °· Tiredness. °· Nausea. °· Vomiting. °Follow these instructions at home: °Medicines °· Take over-the-counter and prescription medicines only as told by your health care provider. °· Do not take aspirin because it can cause bleeding. °· Ask your health care provider if the medicine prescribed to you: °? Requires you to avoid driving or using heavy machinery. °? Can cause constipation. You may need to take actions to prevent or treat constipation, such as: °§ Drink enough fluid to keep your urine pale yellow. °§ Take over-the-counter or prescription medicines. °§ Eat foods that are high in fiber, such as beans, whole grains, and fresh fruits and vegetables. °§ Limit foods that are high in fat and processed sugars, such as fried or sweet foods. °Incision care ° °  ° °· Follow instructions from your health care provider about how to take care of your incision. Make sure you: °? Wash your hands with soap and water before and after you change your bandage (dressing). If soap and water are not available, use hand sanitizer. °? Change your dressing as told by your health care provider. °? Leave stitches (sutures), skin glue, or adhesive strips in place. These skin closures may need to stay in place for 2 weeks or longer. If adhesive strip edges start to loosen and curl up, you may trim the loose edges. Do not remove adhesive strips completely unless your health care provider  tells you to do that. °· Check your incision area every day for signs of infection. Check for: °? Redness, swelling, or pain. °? Fluid or blood. °? Warmth. °? Pus or a bad smell. °Activity °· Rest as told by your health care provider. °· Avoid sitting for a long time without moving. Get up to take short walks every 1-2 hours. This is important to improve blood flow and breathing. Ask for help if you feel weak or unsteady. °· Return to your normal activities as told by your health care provider. Ask your health care provider what activities are safe for you. °General instructions °· Do not take baths, swim, or use a hot tub until your health care provider approves. Ask your health care provider if you may take showers. You may only be allowed to take sponge baths. °· Have someone help you with your daily household tasks for the first few days. °· Keep all follow-up visits as told by your health care provider. This is important. °Contact a health care provider if: °· You have redness, swelling, or pain around your incision. °· Your incision feels warm to the touch. °· You have pus or a bad smell coming from your incision. °· The edges of your incision break open after the sutures have been removed. °· Your pain does not improve after 2-3 days. °· You have a rash. °· You repeatedly become dizzy or light-headed. °· Your pain medicine is not helping. °Get help right away if you: °· Have a fever. °· Faint. °· Have increasing   pain in your abdomen.  Have severe pain in one or both of your shoulders.  Have fluid or blood coming from your sutures or from your vagina.  Have shortness of breath or difficulty breathing.  Have chest pain or leg pain.  Have ongoing nausea, vomiting, or diarrhea. Summary  After the procedure, it is common to have mild discomfort or cramping in your abdomen.  Take over-the-counter and prescription medicines only as told by your health care provider.  Watch for symptoms that should  prompt you to call your health care provider.  Keep all follow-up visits as told by your health care provider. This is important. This information is not intended to replace advice given to you by your health care provider. Make sure you discuss any questions you have with your health care provider. Document Revised: 10/22/2018 Document Reviewed: 04/09/2018 Elsevier Patient Education  2020 Elsevier Inc. Postpartum Care After Vaginal Delivery This sheet gives you information about how to care for yourself from the time you deliver your baby to up to 6-12 weeks after delivery (postpartum period). Your health care provider may also give you more specific instructions. If you have problems or questions, contact your health care provider. Follow these instructions at home: Vaginal bleeding  It is normal to have vaginal bleeding (lochia) after delivery. Wear a sanitary pad for vaginal bleeding and discharge. ? During the first week after delivery, the amount and appearance of lochia is often similar to a menstrual period. ? Over the next few weeks, it will gradually decrease to a dry, yellow-brown discharge. ? For most women, lochia stops completely by 4-6 weeks after delivery. Vaginal bleeding can vary from woman to woman.  Change your sanitary pads frequently. Watch for any changes in your flow, such as: ? A sudden increase in volume. ? A change in color. ? Large blood clots.  If you pass a blood clot from your vagina, save it and call your health care provider to discuss. Do not flush blood clots down the toilet before talking with your health care provider.  Do not use tampons or douches until your health care provider says this is safe.  If you are not breastfeeding, your period should return 6-8 weeks after delivery. If you are feeding your child breast milk only (exclusive breastfeeding), your period may not return until you stop breastfeeding. Perineal care  Keep the area between the  vagina and the anus (perineum) clean and dry as told by your health care provider. Use medicated pads and pain-relieving sprays and creams as directed.  If you had a cut in the perineum (episiotomy) or a tear in the vagina, check the area for signs of infection until you are healed. Check for: ? More redness, swelling, or pain. ? Fluid or blood coming from the cut or tear. ? Warmth. ? Pus or a bad smell.  You may be given a squirt bottle to use instead of wiping to clean the perineum area after you go to the bathroom. As you start healing, you may use the squirt bottle before wiping yourself. Make sure to wipe gently.  To relieve pain caused by an episiotomy, a tear in the vagina, or swollen veins in the anus (hemorrhoids), try taking a warm sitz bath 2-3 times a day. A sitz bath is a warm water bath that is taken while you are sitting down. The water should only come up to your hips and should cover your buttocks. Breast care  Within the first few   days after delivery, your breasts may feel heavy, full, and uncomfortable (breast engorgement). Milk may also leak from your breasts. Your health care provider can suggest ways to help relieve the discomfort. Breast engorgement should go away within a few days.  If you are breastfeeding: ? Wear a bra that supports your breasts and fits you well. ? Keep your nipples clean and dry. Apply creams and ointments as told by your health care provider. ? You may need to use breast pads to absorb milk that leaks from your breasts. ? You may have uterine contractions every time you breastfeed for up to several weeks after delivery. Uterine contractions help your uterus return to its normal size. ? If you have any problems with breastfeeding, work with your health care provider or Advertising copywriter.  If you are not breastfeeding: ? Avoid touching your breasts a lot. Doing this can make your breasts produce more milk. ? Wear a good-fitting bra and use cold  packs to help with swelling. ? Do not squeeze out (express) milk. This causes you to make more milk. Intimacy and sexuality  Ask your health care provider when you can engage in sexual activity. This may depend on: ? Your risk of infection. ? How fast you are healing. ? Your comfort and desire to engage in sexual activity.  You are able to get pregnant after delivery, even if you have not had your period. If desired, talk with your health care provider about methods of birth control (contraception). Medicines  Take over-the-counter and prescription medicines only as told by your health care provider.  If you were prescribed an antibiotic medicine, take it as told by your health care provider. Do not stop taking the antibiotic even if you start to feel better. Activity  Gradually return to your normal activities as told by your health care provider. Ask your health care provider what activities are safe for you.  Rest as much as possible. Try to rest or take a nap while your baby is sleeping. Eating and drinking   Drink enough fluid to keep your urine pale yellow.  Eat high-fiber foods every day. These may help prevent or relieve constipation. High-fiber foods include: ? Whole grain cereals and breads. ? Brown rice. ? Beans. ? Fresh fruits and vegetables.  Do not try to lose weight quickly by cutting back on calories.  Take your prenatal vitamins until your postpartum checkup or until your health care provider tells you it is okay to stop. Lifestyle  Do not use any products that contain nicotine or tobacco, such as cigarettes and e-cigarettes. If you need help quitting, ask your health care provider.  Do not drink alcohol, especially if you are breastfeeding. General instructions  Keep all follow-up visits for you and your baby as told by your health care provider. Most women visit their health care provider for a postpartum checkup within the first 3-6 weeks after  delivery. Contact a health care provider if:  You feel unable to cope with the changes that your child brings to your life, and these feelings do not go away.  You feel unusually sad or worried.  Your breasts become red, painful, or hard.  You have a fever.  You have trouble holding urine or keeping urine from leaking.  You have little or no interest in activities you used to enjoy.  You have not breastfed at all and you have not had a menstrual period for 12 weeks after delivery.  You have stopped  breastfeeding and you have not had a menstrual period for 12 weeks after you stopped breastfeeding.  You have questions about caring for yourself or your baby.  You pass a blood clot from your vagina. Get help right away if:  You have chest pain.  You have difficulty breathing.  You have sudden, severe leg pain.  You have severe pain or cramping in your lower abdomen.  You bleed from your vagina so much that you fill more than one sanitary pad in one hour. Bleeding should not be heavier than your heaviest period.  You develop a severe headache.  You faint.  You have blurred vision or spots in your vision.  You have bad-smelling vaginal discharge.  You have thoughts about hurting yourself or your baby. If you ever feel like you may hurt yourself or others, or have thoughts about taking your own life, get help right away. You can go to the nearest emergency department or call:  Your local emergency services (911 in the [place]  A suicide crisis helpline, such as the National Suicide Prevention Lifeline at 1-800-273-8255. This is open 24 hours a day. Summary  The period of time right after you deliver your newborn up to 6-12 weeks after delivery is called the postpartum period.  Gradually return to your normal activities as told by your health care provider.  Keep all follow-up visits for you and your baby as told by your health care provider. This information is not  intended to replace advice given to you by your health care provider. Make sure you discuss any questions you have with your health care provider. Document Revised: 05/18/2017 Document Reviewed: 02/26/2017 Elsevier Patient Education  2020 Elsevier Inc.  

## 2020-03-29 ENCOUNTER — Encounter (HOSPITAL_COMMUNITY): Payer: Self-pay | Admitting: Family Medicine

## 2020-03-29 NOTE — Transfer of Care (Signed)
Immediate Anesthesia Transfer of Care Note  Patient: Vicki Ford  Procedure(s) Performed: POST PARTUM TUBAL LIGATION (N/A )  Patient Location: PACU  Anesthesia Type:Epidural  Level of Consciousness: awake  Airway & Oxygen Therapy: Patient Spontanous Breathing and Patient connected to nasal cannula oxygen  Post-op Assessment: Report given to RN and Post -op Vital signs reviewed and stable  Post vital signs: Reviewed  Last Vitals:  Vitals Value Taken Time  BP 125/65 03/27/20 0935  Temp 36.6 C 03/27/20 0400  Pulse 63 03/27/20 0400  Resp 16 03/27/20 0400  SpO2 97 % 03/27/20 0400  Vitals shown include unvalidated device data.  Last Pain:  Vitals:   03/27/20 0746  TempSrc:   PainSc: 0-No pain      Patients Stated Pain Goal: 1 (03/25/20 0530)  Complications: No complications documented.

## 2020-04-01 ENCOUNTER — Other Ambulatory Visit: Payer: Self-pay | Admitting: Advanced Practice Midwife

## 2020-04-09 ENCOUNTER — Other Ambulatory Visit: Payer: Self-pay | Admitting: Advanced Practice Midwife

## 2020-04-12 ENCOUNTER — Other Ambulatory Visit: Payer: Self-pay

## 2020-04-12 DIAGNOSIS — Z8659 Personal history of other mental and behavioral disorders: Secondary | ICD-10-CM

## 2020-04-12 NOTE — Progress Notes (Signed)
[place] postpartum nurse called stating patient scored and 11 on [place] today. Patient has histoy of depression will put referral in for patient to start off seeing Earney Mallet.

## 2020-04-13 ENCOUNTER — Other Ambulatory Visit: Payer: Self-pay | Admitting: Advanced Practice Midwife

## 2020-04-13 NOTE — BH Specialist Note (Signed)
Integrated Behavioral Health via Telemedicine phone (Caregility) Visit  04/13/2020 Vicki Ford [id number]  Number of Integrated Behavioral Health visits: 1 Session Start time: 10:15  Session End time: 11:10 Total time: 55  minutes  Referring Provider: Candelaria Celeste, DO Type of Service: Individual Patient/Family location: Home [facility] Provider location: Center for [facility]  All persons participating in visit: Patient Vicki Ford and [facility] Vedant Shehadeh [place]   I connected with Vicki Ford a video enabled telemedicine application (Caregility) and verified that I am speaking with the correct person using two identifiers.   Discussed confidentiality: Yes   Confirmed demographics & insurance:  Yes   I discussed that engaging in this virtual visit, they consent to the provision of behavioral healthcare and the services will be billed under their insurance.  Patient and/or legal guardian expressed understanding and consented to virtual visit: Yes   PRESENTING CONCERNS: Patient and/or family reports the following symptoms/concerns: Pt states her primary concern today is mood instability(goes back and forth between "too happy" that others have noticed to "angry"; spending sprees); has tried [facility] medication in the past that have not managed symptoms(Zoloft, Lexapro, Rexulti, Atarax). Pt notices "pins and needles" feeling in hands and migraines with "sensory overload"(loud noises, flashing lights, etc.). Pt lost her mother in early 2021; family history of bipolar disorder (sister).Pt copes best by taking a hot shower, sound-proof headset w music, or driving; is open to referral to psychiatry and learning additional self-coping strategy today. Duration of problem: Ongoing; Severity of problem: severe  STRENGTHS (Protective Factors/Coping Skills): Social connections and Concrete supports in place (healthy food, safe environments, etc.)  ASSESSMENT: Patient  currently experiencing Mood disorder.    GOALS ADDRESSED: Patient will: 1.  Reduce symptoms of: anxiety, depression, mood instability and stress  2.  Increase knowledge and/or ability of: self-management skills  3.  Demonstrate ability to: Increase healthy adjustment to current life circumstances, Increase adequate support systems for patient/family and Increase motivation to adhere to plan of care   Progress of Goals: Ongoing  INTERVENTIONS: Interventions utilized:  Mindfulness or Management consultant, Psychoeducation and/or Health Education and Link to [employer] Standardized Assessments completed & reviewed: MDQ, GAD-7 and PHQ 9   OUTCOME: Patient Response: Pt agrees with treatment plan   PLAN: 1. Follow up with behavioral health clinician on : Two weeks 2. Behavioral recommendations:  -CALM relaxation breathing exercise twice daily (morning; at bedtime) -Accept referral to psychiatry for Behavioral Health Medication Management -Continue using self-coping strategies that are helping to manage symptoms (hot bath/shower, music w headset, drive) -Consider registering for new mom online support group at either www.conehealthybaby.com or www.postpartum.net  3. Referral(s): Integrated Art gallery manager (In Clinic) and [employer]:  new mom support  I discussed the assessment and treatment plan with the patient and/or parent/guardian. They were provided an opportunity to ask questions and all were answered. They agreed with the plan and demonstrated an understanding of the instructions.   They were advised to call back or seek an in-person evaluation as appropriate.  I discussed that the purpose of this visit is to provide behavioral health care while limiting exposure to the novel coronavirus.  Discussed there is a possibility of technology failure and discussed alternative modes of communication if that failure occurs.  Valetta Close Azaya Goedde  Depression screen  [facility] 2/9 04/14/2020  Decreased Interest 3  Down, Depressed, Hopeless 1  PHQ - 2 Score 4  Altered sleeping 3  Tired, decreased energy 3  Change in appetite 0  Feeling bad or failure about yourself  0  Trouble concentrating 1  Moving slowly or fidgety/restless 1  Suicidal thoughts 0  PHQ-9 Score 12   GAD 7 : Generalized Anxiety Score 04/14/2020  Nervous, Anxious, on Edge 3  Control/stop worrying 3  Worry too much - different things 3  Trouble relaxing 1  Restless 1  Easily annoyed or irritable 1  Afraid - awful might happen 3  Total GAD 7 Score 15

## 2020-04-14 ENCOUNTER — Ambulatory Visit (INDEPENDENT_AMBULATORY_CARE_PROVIDER_SITE_OTHER): Payer: BC Managed Care – PPO | Admitting: Clinical

## 2020-04-14 DIAGNOSIS — F39 Unspecified mood [affective] disorder: Secondary | ICD-10-CM | POA: Diagnosis not present

## 2020-04-14 NOTE — Patient Instructions (Addendum)
Center for [facility] at [facility] [address] [place], [place] [address] [phone] (main office) [phone] (Tashema Tiller's office)  New mom support groups:  Www.conehealthybaby.com Www.postpartum.net   /[employer] and Websites Here are a few free apps meant to help you to help yourself.  To find, try searching on the internet to see if the app is offered on Apple/Android devices. If your first choice doesn't come up on your device, the good news is that there are many choices! Play around with different apps to see which ones are helpful to you.    Calm This is an app meant to help increase calm feelings. Includes info, strategies, and tools for tracking your feelings.      Calm Harm  This app is meant to help with self-harm. Provides many 5-minute or 15-min coping strategies for doing instead of hurting yourself.       Healthy Minds Health Minds is a problem-solving tool to help deal with emotions and cope with stress you encounter wherever you are.      MindShift This app can help people cope with anxiety. Rather than trying to avoid anxiety, you can make an important shift and face it.      MY3  MY3 features a support system, safety plan and resources with the goal of offering a tool to use in a time of need.       My Life My Voice  This mood journal offers a simple solution for tracking your thoughts, feelings and moods. Animated emoticons can help identify your mood.       Relax Melodies Designed to help with sleep, on this app you can mix sounds and meditations for relaxation.      Smiling Mind Smiling Mind is meditation made easy: it's a simple tool that helps put a smile on your mind.        Stop, Breathe & Think  A friendly, simple guide for people through meditations for mindfulness and compassion.  Stop, Breathe and Think Kids Enter your current feelings and choose a "mission" to help you cope. Offers  videos for certain moods instead of just sound recordings.       Team Orange The goal of this tool is to help teens change how they think, act, and react. This app helps you focus on your own good feelings and experiences.      The [employer] Box The [employer] Box (VHB) contains simple tools to help patients with coping, relaxation, distraction, and positive thinking.    Behavioral Health Resources:   What if I or someone I know is in crisis?  . If you are thinking about harming yourself or having thoughts of suicide, or if you know someone who is, seek help right away.  . Call your doctor or mental health care provider.  . Call 911 or go to a hospital emergency room to get immediate help, or ask a friend or family member to help you do these things; IF YOU ARE IN [place], YOU MAY GO TO WALK-IN URGENT CARE 24/7 at [facility] (see below)  . Call the [place] National Suicide Prevention Lifeline's toll-free, 24-hour hotline at 1-800-273-TALK 548-092-3617) or TTY: 1-800-799-4 TTY (320) 196-2000) to talk to a trained counselor.  . If you are in crisis, make sure you are not left alone.   . If someone else is in crisis, make sure he or she is not left alone   24  Hour :   [facility]  [address] [place], [place] [address] [phone] or [phone] WALK-IN URGENT CARE 24/7  [facility] Mobile Crisis: (580)[phone]  [place] National Suicide Hotline: 478-206-6203  Family Service of the [employer] (Domestic Violence, Rape & Victim Assistance)  580-336-8300  [facility]  [address], [place]  [address]   [phone] or [phone]   [facility] [employer] Crisis Services: [phone] (8am-4pm) or ([phone]) [phone] (after hours)        [facility], [address] [place], [place]  [phone] Fax:  [phone] [email or web address] *Interpreters available *Accepts all insurance and uninsured for Urgent Care needs *Accepts Medicaid and uninsured for outpatient treatment

## 2020-04-19 NOTE — BH Specialist Note (Signed)
Integrated Behavioral Health via Telemedicine Visit  04/19/2020 Vicki Ford [id number]  Number of Integrated Behavioral Health visits: 2 Session Start time: 10:51  Session End time: 11:21 Total time: 30  Referring Provider: Candelaria Celeste, DO Patient/Family location: Home [facility] Provider location: [facility]  All persons participating in visit: Patient Vicki Ford and [facility] Warren Kugelman   Types of Service: Individual psychotherapy  I connected with Vicki Ford by Video enabled telemedicine application [employer] and verified that I am speaking with the correct person using two identifiers.    Discussed confidentiality: at previous visit  I discussed the limitations of telemedicine and the availability of in person appointments.  Discussed there is a possibility of technology failure and discussed alternative modes of communication if that failure occurs.  I discussed that engaging in this telemedicine visit, they consent to the provision of behavioral healthcare and the services will be billed under their insurance.  Patient and/or legal guardian expressed understanding and consented to Telemedicine visit: Yes   Presenting Concerns: Patient and/or family reports the following symptoms/concerns: Pt states her primary concern is anxiety "at an all-time high" (attributed to lack of quality sleep, worried about BH medication affecting milk supply, migraine postpartum, and worry about going back to work from home in two weeks with increased household demands). Pt used breathing exercises to help manage anxiety surrounding recent holiday w extended family, and plans to attend appointment with psychiatry in two weeks.  Duration of problem: Ongoing; Severity of problem: severe  Patient and/or Family's Strengths/Protective Factors: Social connections and Concrete supports in place (healthy food, safe environments, etc.)  Goals  Addressed: Patient will: 1.  Reduce symptoms of: anxiety, depression, insomnia, mood instability and stress  2.  Demonstrate ability to: Increase healthy adjustment to current life circumstances and Increase motivation to adhere to plan of care  Progress towards Goals: Ongoing  Interventions: Interventions utilized:  Motivational Interviewing and Link to [employer] Assessments completed: PHQ9/GAD7 given in past two weeks  Patient and/or Family Response: Pt agrees to updated treatment plan  Assessment: Patient currently experiencing Mood disorder.   Patient may benefit from psychoeducation and brief therapeutic interventions regarding coping with symptoms of anxiety, depression, stress, mood instability .  Plan: 1. Follow up with behavioral health clinician on : As needed 2. Behavioral recommendations:  -Continue with plan to attend postpartum visit on 04/29/20: discuss ongoing pain after birth, along with migraine postpartum with medical provider at that visit -Continue with plan to attend virtual psychiatry appointment on 05/11/20: discuss medication options regarding affect on milk supply with psychiatrist -Continue using self-coping strategies (relaxation breathing exercises, hot bath/showers, music, long drives) daily, for as long as remains helpful -Consider practicing "time boundary" with older children: start with 5 minutes/day of no interruptions -Consider looking up new mom support groups on www.postpartum.net to see options available 3. Referral(s): Integrated Art gallery manager (In Clinic) and [employer]:  New mom support  I discussed the assessment and treatment plan with the patient and/or parent/guardian. They were provided an opportunity to ask questions and all were answered. They agreed with the plan and demonstrated an understanding of the instructions.   They were advised to call back or seek an in-person evaluation if the  symptoms worsen or if the condition fails to improve as anticipated.  Rae Lips, LCSW   Depression screen [facility] 2/9 04/14/2020  Decreased Interest 3  Down, Depressed, Hopeless 1  PHQ - 2 Score  4  Altered sleeping 3  Tired, decreased energy 3  Change in appetite 0  Feeling bad or failure about yourself  0  Trouble concentrating 1  Moving slowly or fidgety/restless 1  Suicidal thoughts 0  PHQ-9 Score 12   GAD 7 : Generalized Anxiety Score 04/14/2020  Nervous, Anxious, on Edge 3  Control/stop worrying 3  Worry too much - different things 3  Trouble relaxing 1  Restless 1  Easily annoyed or irritable 1  Afraid - awful might happen 3  Total GAD 7 Score 15

## 2020-04-28 ENCOUNTER — Ambulatory Visit (INDEPENDENT_AMBULATORY_CARE_PROVIDER_SITE_OTHER): Payer: BC Managed Care – PPO | Admitting: Clinical

## 2020-04-28 DIAGNOSIS — F39 Unspecified mood [affective] disorder: Secondary | ICD-10-CM | POA: Diagnosis not present

## 2020-04-29 ENCOUNTER — Other Ambulatory Visit: Payer: Self-pay

## 2020-04-29 ENCOUNTER — Ambulatory Visit (INDEPENDENT_AMBULATORY_CARE_PROVIDER_SITE_OTHER): Payer: BC Managed Care – PPO | Admitting: Family Medicine

## 2020-04-29 ENCOUNTER — Encounter: Payer: Self-pay | Admitting: Family Medicine

## 2020-04-29 DIAGNOSIS — F53 Postpartum depression: Secondary | ICD-10-CM

## 2020-04-29 DIAGNOSIS — O8609 Infection of obstetric surgical wound, other surgical site: Secondary | ICD-10-CM

## 2020-04-29 DIAGNOSIS — O99345 Other mental disorders complicating the puerperium: Secondary | ICD-10-CM

## 2020-04-29 MED ORDER — METRONIDAZOLE 500 MG PO TABS: 500.0000 mg | ORAL_TABLET | Freq: Three times a day (TID) | ORAL | 0 refills | Status: AC

## 2020-04-29 MED ORDER — HYDROXYZINE HCL 25 MG PO TABS: 25.0000 mg | ORAL_TABLET | Freq: Four times a day (QID) | ORAL | 2 refills | Status: DC | PRN

## 2020-04-29 NOTE — Progress Notes (Signed)
Post Partum Visit Note  Vicki Ford is a 29 y.o. [phone] female who presents for a postpartum visit. She is 4 weeks postpartum following a normal spontaneous vaginal delivery and forceps delivery.  I have fully reviewed the prenatal and intrapartum course. The delivery was at 39 gestational weeks.  Anesthesia: epidural. Postpartum course has been normal. Baby is doing well. Baby is feeding by breast. Bleeding staining only. Bowel function is abnormal: constipation. Bladder function is normal. Patient is not sexually active. Contraception method is tubal ligation. Postpartum depression screening: positive.   The pregnancy intention screening data noted above was reviewed. Potential methods of contraception were discussed. The patient elected to proceed with Female Sterilization.    Edinburgh Postnatal Depression Scale - 04/29/20 1009      Edinburgh Postnatal Depression Scale:  In the Past 7 Days   I have been able to laugh and see the funny side of things. 1    I have looked forward with enjoyment to things. 1    I have blamed myself unnecessarily when things went wrong. 2    I have been anxious or worried for no good reason. 3    I have felt scared or panicky for no good reason. 2    Things have been getting on top of me. 2    I have been so unhappy that I have had difficulty sleeping. 2    I have felt sad or miserable. 2    I have been so unhappy that I have been crying. 1    The thought of harming myself has occurred to me. 0    Edinburgh Postnatal Depression Scale Total 16            The following portions of the patient's history were reviewed and updated as appropriate: allergies, current medications, past family history, past medical history, past social history, past surgical history and problem list.  Review of Systems Pertinent items are noted in HPI.    Objective:  BP 120/70   Pulse 77   Ht 5\' 1"  (1.549 m)   Wt 177 lb (80.3 kg)   LMP 06/09/2019 (Exact Date)    Breastfeeding Yes   BMI 33.44 kg/m    General:  alert, cooperative and no distress  Lungs: clear to auscultation bilaterally  Heart:  regular rate and rhythm, S1, S2 normal, no murmur, click, rub or gallop  Abdomen: soft, non-tender; bowel sounds normal; no masses,  no organomegaly  Vagina: Has inflammation and tenderness of perineum. No abscess.        Assessment:    Normal postpartum exam. Pap smear not done at today's visit.   Plan:   Essential components of care per ACOG recommendations:  1.  Mood and well being: Patient with positive depression screening today. Reviewed local resources for support. Has severe anxiety, is currently seeing 08/07/2019 and has appt in 10 days with psychiatry. Will start vistaril 25mg  q6 hrs prn anxiety.  - Patient does not use tobacco.  - hx of drug use? No    2. Infant care and feeding:  -Patient currently breastmilk feeding? Yes  -Social determinants of health (SDOH) reviewed in EPIC. No concerns  3. Sexuality, contraception and birth spacing - Patient does not want a pregnancy in the next year.  Desired family size is 3 children.  - Reviewed forms of contraception in tiered fashion. Patient desired bilateral tubal ligation today.   - Discussed birth spacing of 18 months  4. Sleep and  fatigue -Encouraged family/partner/community support of 4 hrs of uninterrupted sleep to help with mood and fatigue  5. Physical Recovery  - Discussed patients delivery and complications - Patient had a periurethral laceration, perineal healing reviewed. Patient expressed understanding - Patient has urinary incontinence? No - Patient is not safe to resume physical and sexual activity  6.  Health Maintenance - Last pap smear done 3/21 and was normal with negative HPV.   7. Perineal inflammation Start flagyl TID x7 days. Return next week for evaluation - PCP follow up  Levie Heritage, DO [facility] [employer], [facility]

## 2020-05-11 ENCOUNTER — Encounter (HOSPITAL_COMMUNITY): Payer: Self-pay | Admitting: Psychiatry

## 2020-05-11 ENCOUNTER — Telehealth (INDEPENDENT_AMBULATORY_CARE_PROVIDER_SITE_OTHER): Payer: BC Managed Care – PPO | Admitting: Psychiatry

## 2020-05-11 DIAGNOSIS — F411 Generalized anxiety disorder: Secondary | ICD-10-CM | POA: Diagnosis not present

## 2020-05-11 DIAGNOSIS — F0630 Mood disorder due to known physiological condition, unspecified: Secondary | ICD-10-CM | POA: Diagnosis not present

## 2020-05-11 MED ORDER — LAMOTRIGINE 25 MG PO TABS: 25.0000 mg | ORAL_TABLET | Freq: Every day | ORAL | 0 refills | Status: DC
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
## 2020-05-12 ENCOUNTER — Other Ambulatory Visit (HOSPITAL_COMMUNITY): Payer: Self-pay

## 2020-05-12 ENCOUNTER — Ambulatory Visit: Payer: BC Managed Care – PPO | Admitting: Family Medicine

## 2020-05-12 MED ORDER — LAMOTRIGINE 25 MG PO TABS: 25.0000 mg | ORAL_TABLET | Freq: Every day | ORAL | 0 refills | Status: DC

## 2020-05-14 ENCOUNTER — Other Ambulatory Visit (HOSPITAL_COMMUNITY): Payer: Self-pay

## 2020-05-14 MED ORDER — LAMOTRIGINE 25 MG PO TABS: 25.0000 mg | ORAL_TABLET | Freq: Every day | ORAL | 0 refills | Status: DC

## 2020-06-08 ENCOUNTER — Encounter (HOSPITAL_COMMUNITY): Payer: Self-pay | Admitting: Psychiatry

## 2020-06-08 ENCOUNTER — Telehealth (INDEPENDENT_AMBULATORY_CARE_PROVIDER_SITE_OTHER): Payer: 59 | Admitting: Psychiatry

## 2020-06-08 DIAGNOSIS — F411 Generalized anxiety disorder: Secondary | ICD-10-CM | POA: Diagnosis not present

## 2020-06-08 DIAGNOSIS — F0630 Mood disorder due to known physiological condition, unspecified: Secondary | ICD-10-CM | POA: Diagnosis not present

## 2020-06-08 MED ORDER — LAMOTRIGINE 25 MG PO TABS
25.0000 mg | ORAL_TABLET | Freq: Every day | ORAL | 0 refills | Status: DC
Start: 2020-06-08 — End: 2020-07-09
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
## 2020-07-09 ENCOUNTER — Other Ambulatory Visit (HOSPITAL_COMMUNITY): Payer: Self-pay | Admitting: Psychiatry

## 2020-07-09 ENCOUNTER — Telehealth (INDEPENDENT_AMBULATORY_CARE_PROVIDER_SITE_OTHER): Payer: 59 | Admitting: Psychiatry

## 2020-07-09 ENCOUNTER — Encounter (HOSPITAL_COMMUNITY): Payer: Self-pay | Admitting: Psychiatry

## 2020-07-09 DIAGNOSIS — F411 Generalized anxiety disorder: Secondary | ICD-10-CM

## 2020-07-09 DIAGNOSIS — F0630 Mood disorder due to known physiological condition, unspecified: Secondary | ICD-10-CM | POA: Diagnosis not present
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
## 2020-08-06 ENCOUNTER — Telehealth (HOSPITAL_COMMUNITY): Payer: 59 | Admitting: Psychiatry

## 2020-09-15 ENCOUNTER — Encounter: Payer: Self-pay | Admitting: General Practice

## 2021-03-02 ENCOUNTER — Ambulatory Visit (INDEPENDENT_AMBULATORY_CARE_PROVIDER_SITE_OTHER): Payer: No Payment, Other | Admitting: Clinical

## 2021-03-02 ENCOUNTER — Other Ambulatory Visit: Payer: Self-pay

## 2021-03-02 DIAGNOSIS — F319 Bipolar disorder, unspecified: Secondary | ICD-10-CM | POA: Diagnosis not present
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
## 2021-04-04 ENCOUNTER — Ambulatory Visit (HOSPITAL_COMMUNITY): Payer: Self-pay | Admitting: Clinical

## 2021-04-08 ENCOUNTER — Ambulatory Visit (HOSPITAL_COMMUNITY): Payer: No Payment, Other | Admitting: Family

## 2021-04-08 ENCOUNTER — Other Ambulatory Visit: Payer: Self-pay

## 2021-04-08 ENCOUNTER — Encounter (HOSPITAL_COMMUNITY): Payer: Self-pay | Admitting: Family

## 2021-04-08 VITALS — BP 117/86 | HR 86 | Ht 61.0 in | Wt 202.0 lb

## 2021-04-08 DIAGNOSIS — F411 Generalized anxiety disorder: Secondary | ICD-10-CM

## 2021-04-08 DIAGNOSIS — F319 Bipolar disorder, unspecified: Secondary | ICD-10-CM

## 2021-04-08 DIAGNOSIS — F0630 Mood disorder due to known physiological condition, unspecified: Secondary | ICD-10-CM

## 2021-04-08 MED ORDER — LAMOTRIGINE 25 MG PO TABS: 25.0000 mg | ORAL_TABLET | Freq: Every day | ORAL | 2 refills | Status: DC

## 2021-04-08 MED ORDER — TRAZODONE HCL 50 MG PO TABS
50.0000 mg | ORAL_TABLET | Freq: Every day | ORAL | 1 refills | Status: DC
  Filled 2021-04-08: qty 30, 30d supply, fill #0
  Filled 2021-05-10: qty 30, 30d supply, fill #1

## 2021-04-08 MED ORDER — HYDROXYZINE PAMOATE 25 MG PO CAPS
25.0000 mg | ORAL_CAPSULE | Freq: Three times a day (TID) | ORAL | 1 refills | Status: DC | PRN
  Filled 2021-04-08: qty 45, 15d supply, fill #0
  Filled 2021-05-10: qty 45, 15d supply, fill #1

## 2021-04-08 MED ORDER — FLUOXETINE HCL 10 MG PO CAPS
10.0000 mg | ORAL_CAPSULE | Freq: Every day | ORAL | 2 refills | Status: DC
  Filled 2021-04-08: qty 30, 30d supply, fill #0
  Filled 2021-05-10: qty 30, 30d supply, fill #1
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
## 2021-04-10 NOTE — Progress Notes (Signed)
Erroneous encounter

## 2021-04-14 ENCOUNTER — Encounter: Payer: Medicaid Other | Admitting: Family

## 2021-04-14 DIAGNOSIS — Z7689 Persons encountering health services in other specified circumstances: Secondary | ICD-10-CM

## 2021-05-10 ENCOUNTER — Other Ambulatory Visit: Payer: Self-pay

## 2021-05-11 ENCOUNTER — Other Ambulatory Visit: Payer: Self-pay

## 2021-05-30 IMAGING — US US OB < 14 WEEKS - US OB TV
1 series · 15 of 28 positions shown · non-contrast
Comparison: None.

CLINICAL DATA: Pelvic pain with vaginal bleeding

EXAM:
OBSTETRIC <14 WK US AND TRANSVAGINAL OB US
TECHNIQUE: Both transabdominal and transvaginal ultrasound examinations were
performed for complete evaluation of the gestation as well as the
maternal uterus, adnexal regions, and pelvic cul-de-sac.
Transvaginal technique was performed to assess early pregnancy.

[Series 1: us ob < 14 weeks - us ob tv · 15 of 40 slices shown]
[im 1/40]
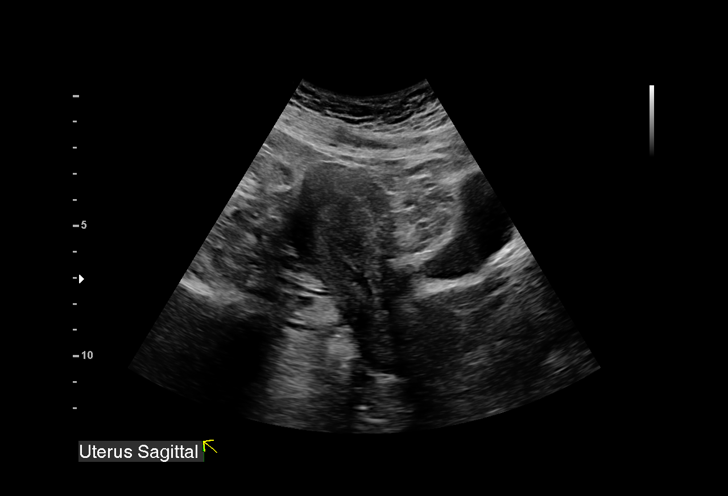
[im 3/40]
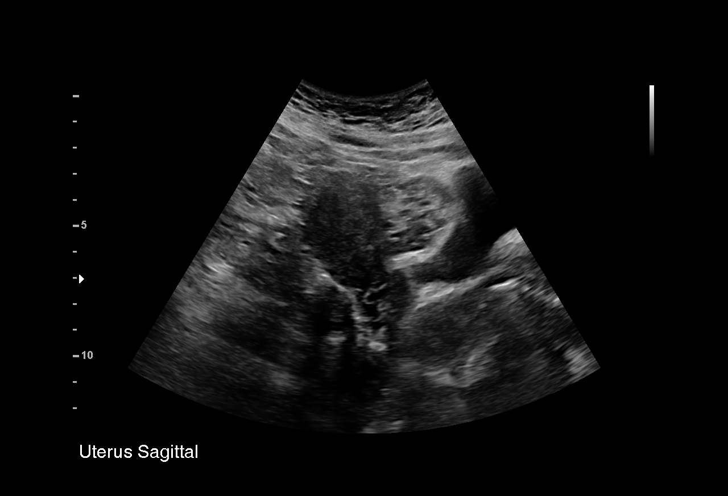
[im 6/40]
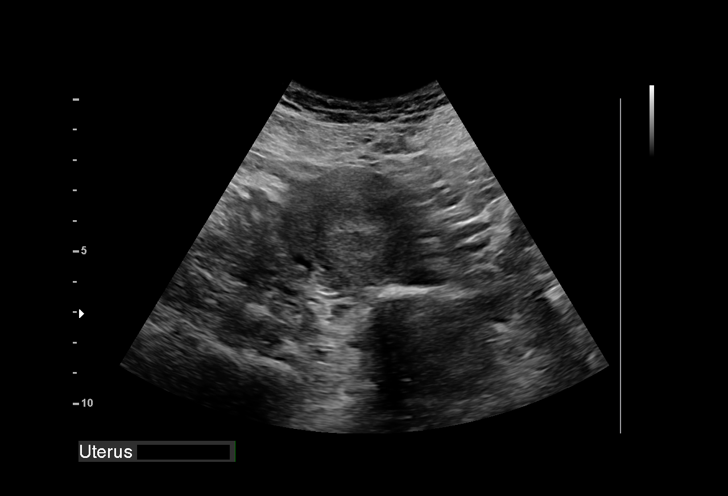
[im 9/40]
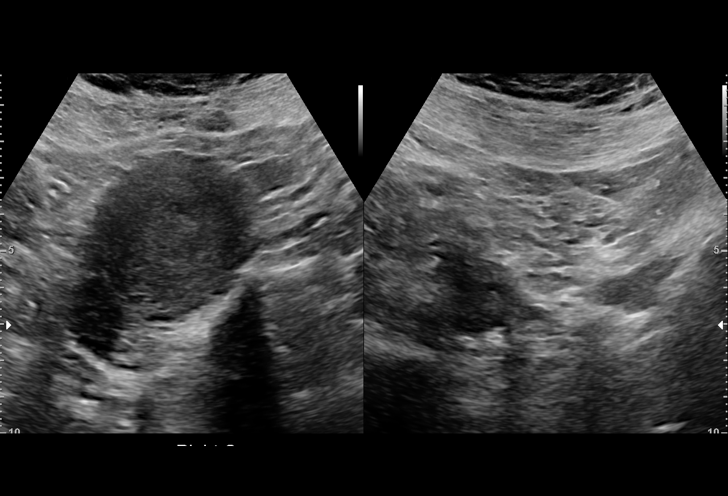
[im 12/40]
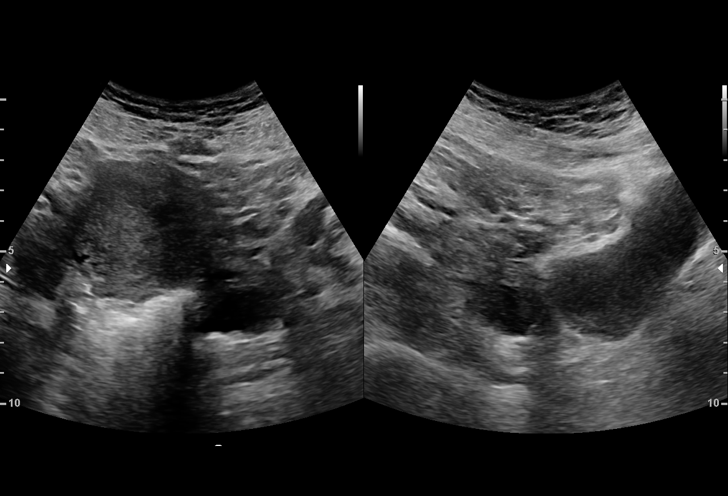
[im 15/40]
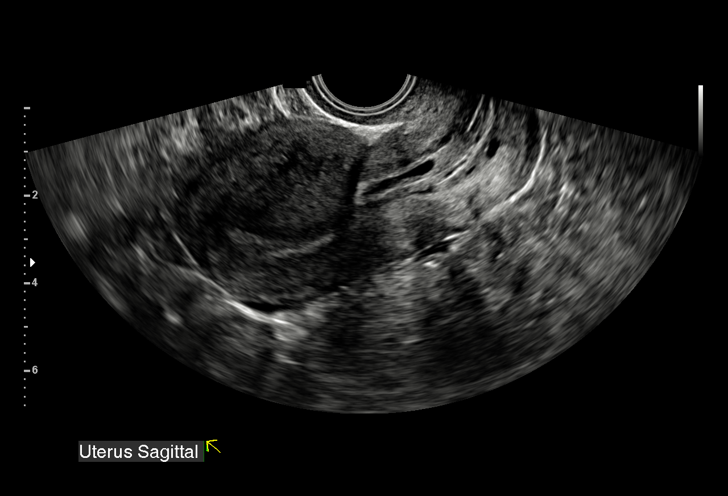
[im 18/40]
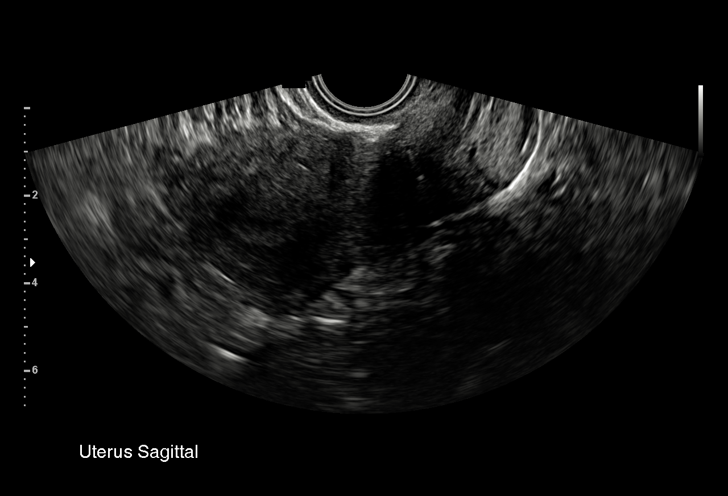
[im 21/40]
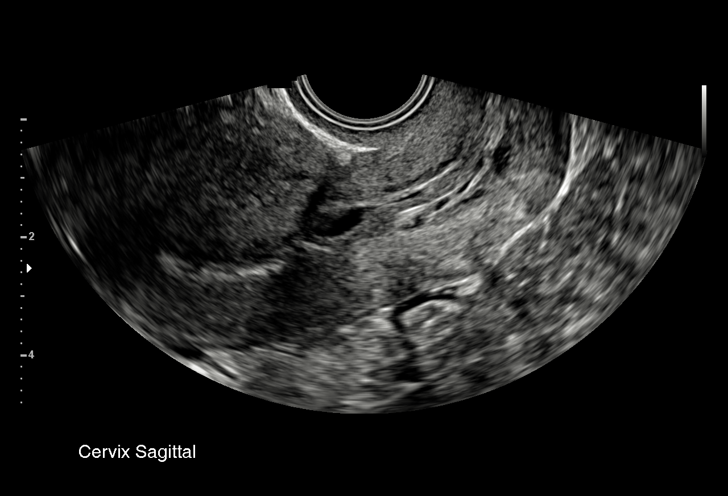
[im 22/40]
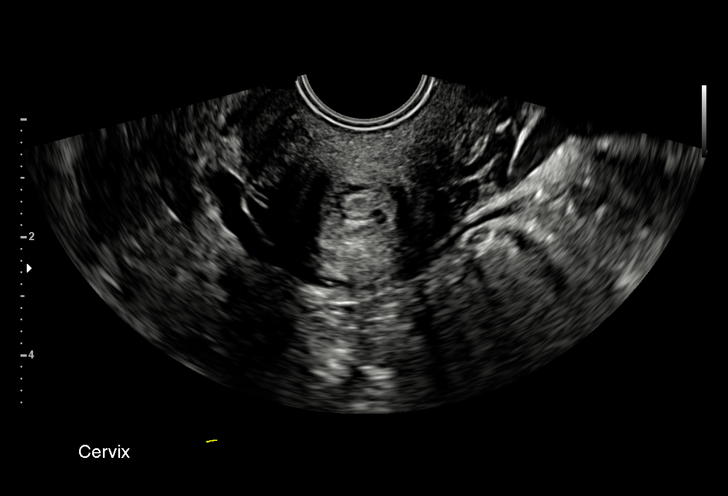
[im 25/40]
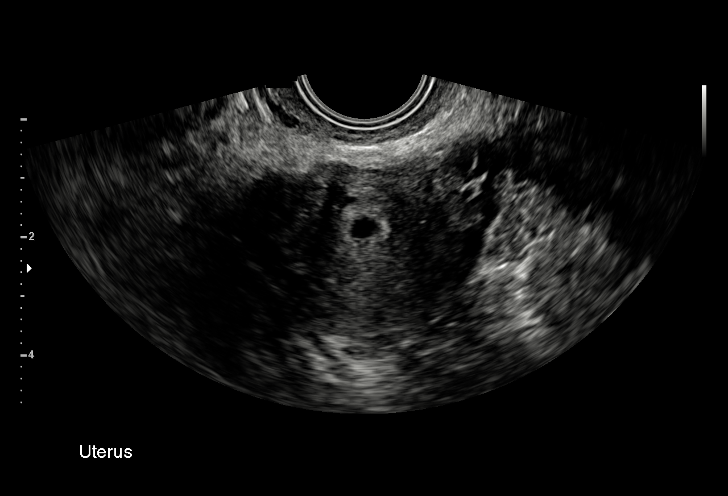
[im 28/40]
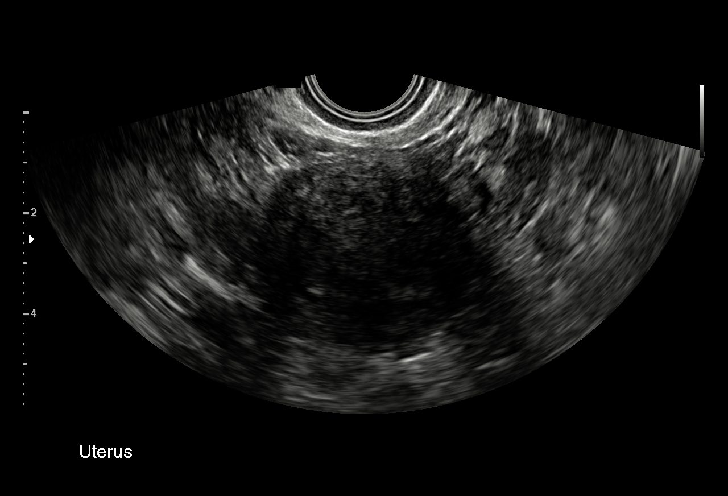
[im 31/40]
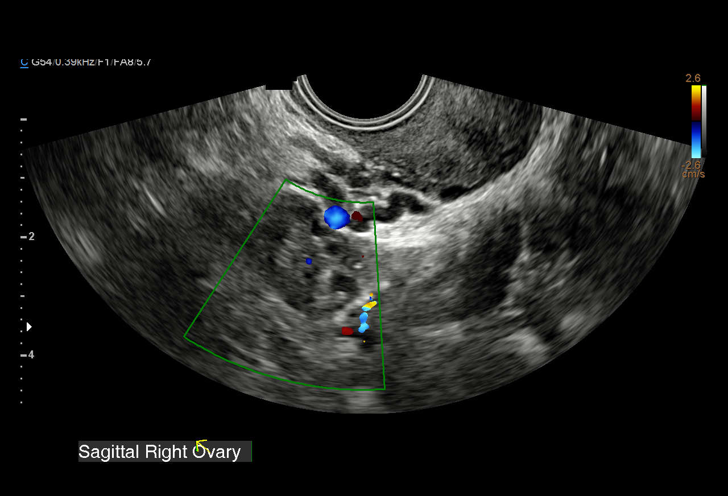
[im 34/40]
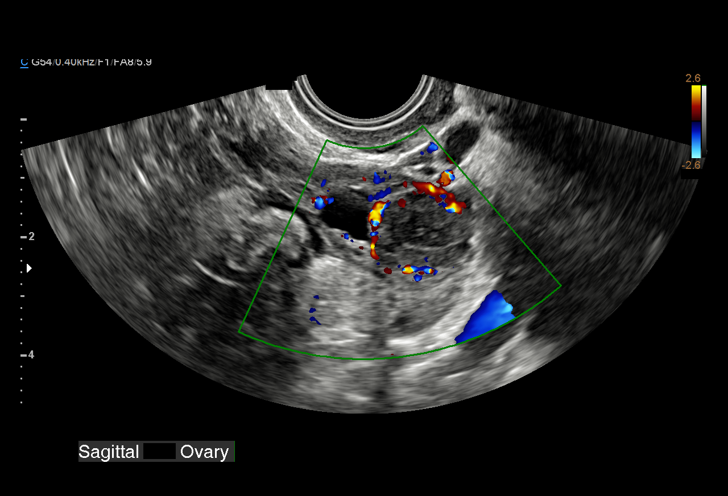
[im 37/40]
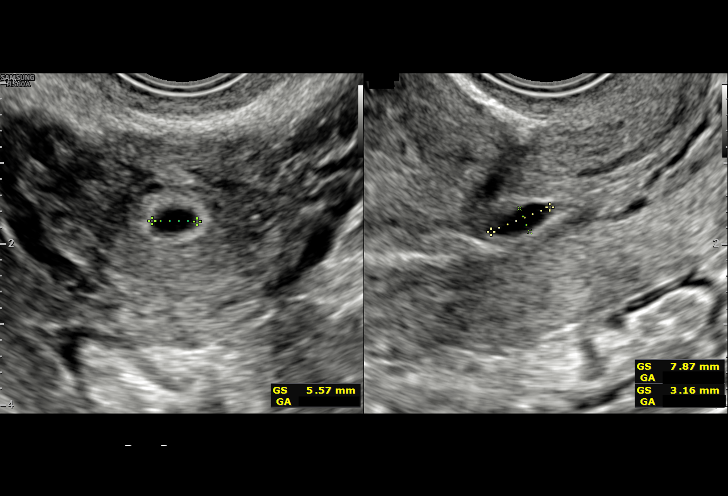
[im 40/40]
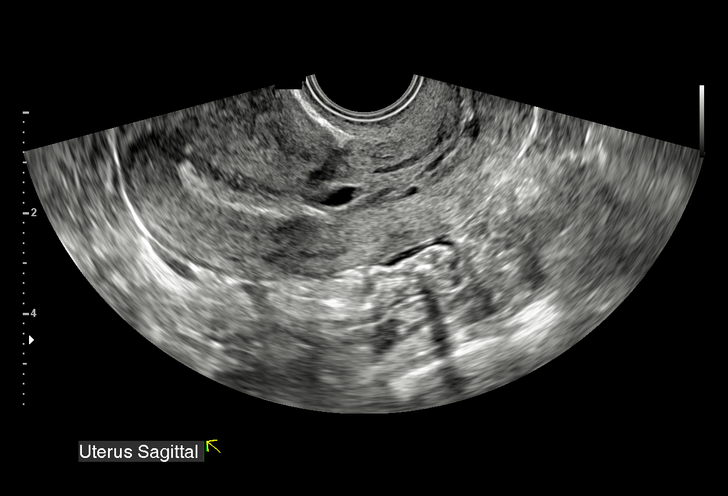

[15 of 28 positions shown; findings below may reference images not displayed]

FINDINGS: Intrauterine gestational sac: Visualized

Yolk sac:  Visualized

Embryo:  Not visualized

Cardiac Activity: Not visualized

MSD: 6 mm mm   5 w   2 d

Subchorionic hemorrhage:  None visualized.

Maternal uterus/adnexae: Cervical os is closed. Right ovary measures
2.4 x 1.6 x 1.7 cm. Left ovary measures 3.2 x 2.3 x 2.5 cm. No
extrauterine pelvic or adnexal mass. No free pelvic fluid.
IMPRESSION: There is a small gestational sac within the uterus. There is an
apparent yolk sac but no fetal pole seen currently. This finding
warrants a follow-up ultrasound in 10-12 weeks to assess for fetal
pole and fetal heart activity. No subchorionic hemorrhage evident.

Study otherwise unremarkable.

## 2021-06-01 IMAGING — US US OB TRANSVAGINAL
1 series · 15 of 28 positions shown · non-contrast
Comparison: 04/28/2019

CLINICAL DATA: Bleeding

EXAM:
TRANSVAGINAL OB ULTRASOUND
TECHNIQUE: Transvaginal ultrasound was performed for complete evaluation of the
gestation as well as the maternal uterus, adnexal regions, and
pelvic cul-de-sac.

[Series 1: us ob transvaginal · 15 of 37 slices shown]
[im 1/37]
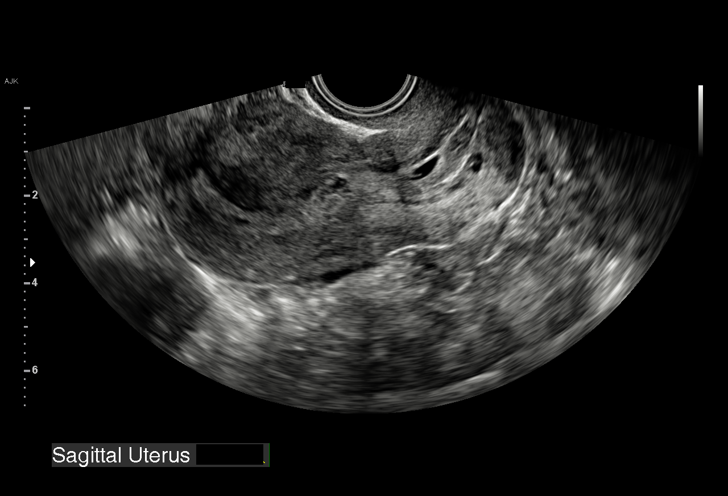
[im 3/37]
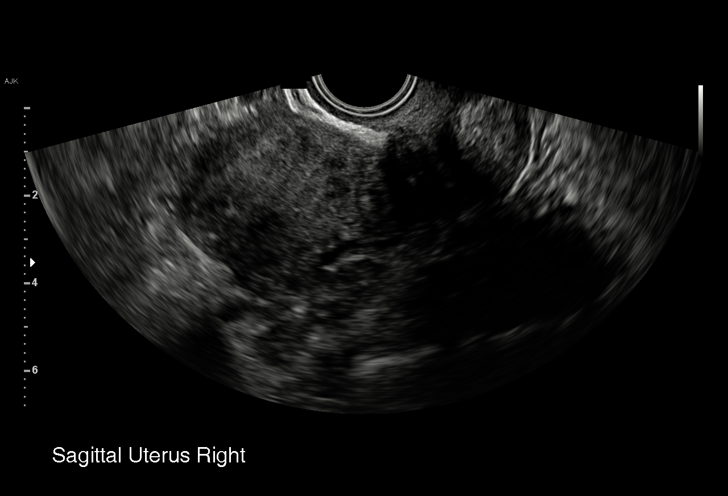
[im 6/37]
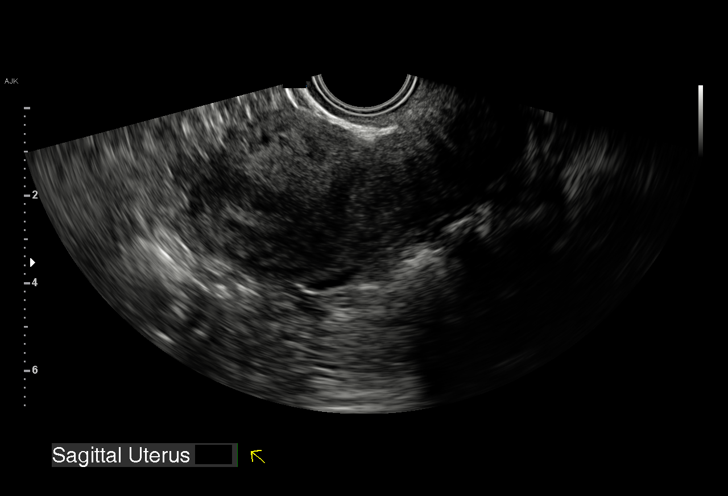
[im 9/37]
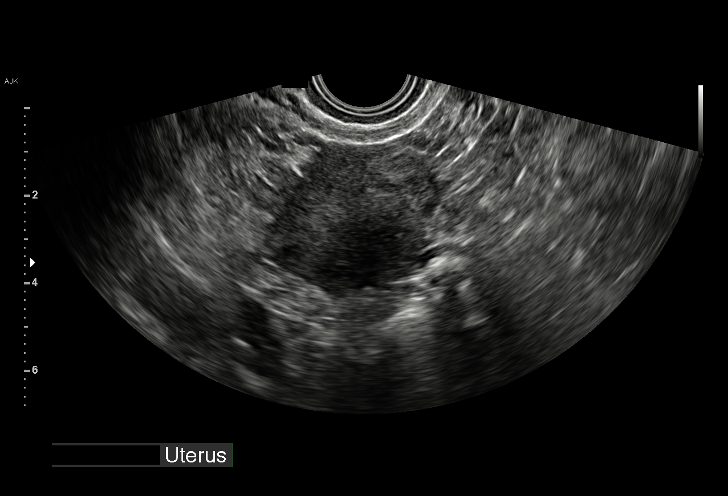
[im 11/37]
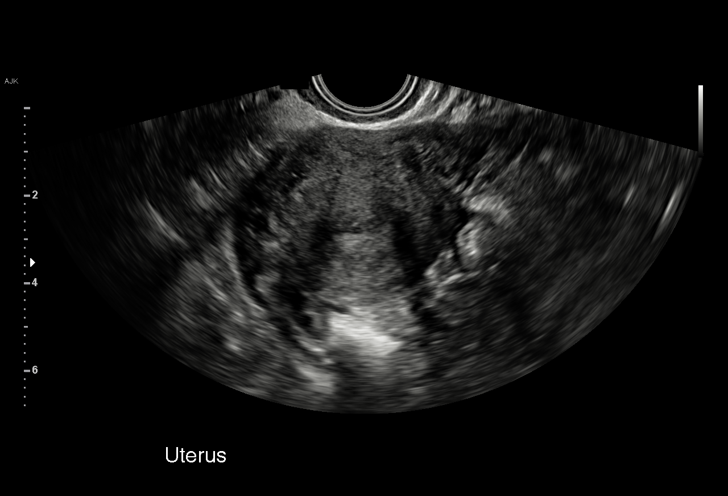
[im 14/37]
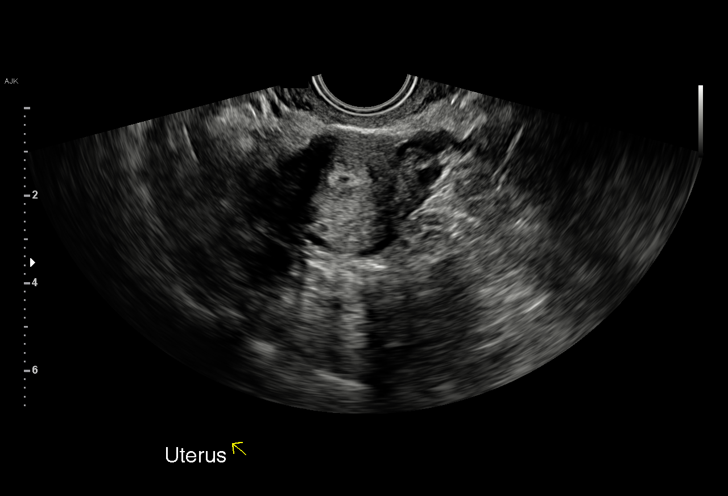
[im 17/37]
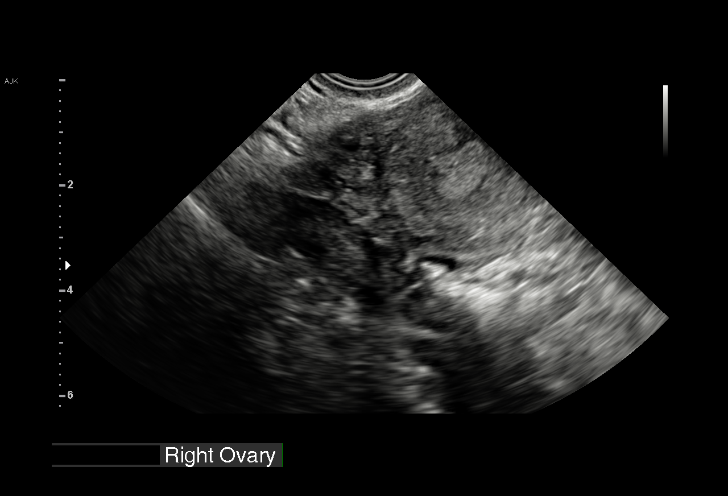
[im 19/37]
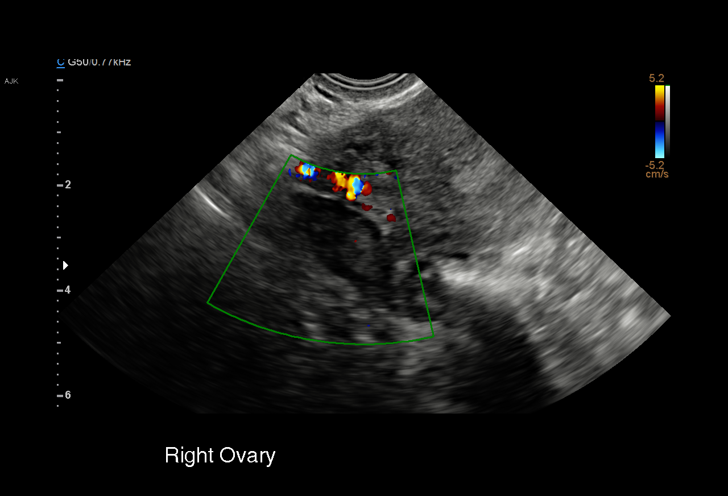
[im 21/37]
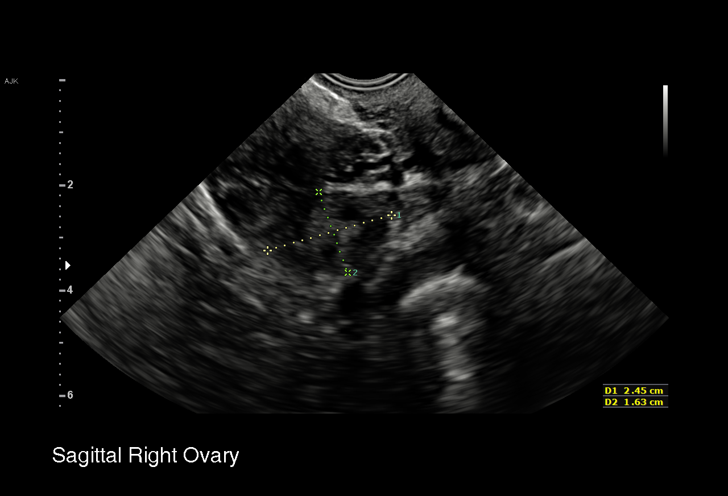
[im 23/37]
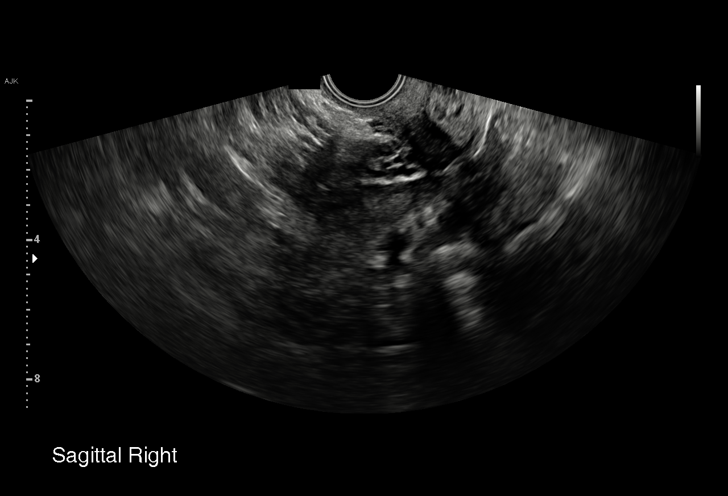
[im 26/37]
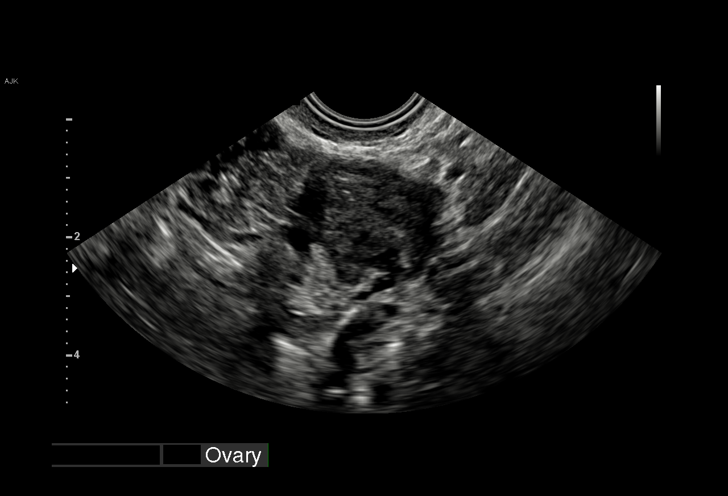
[im 29/37]
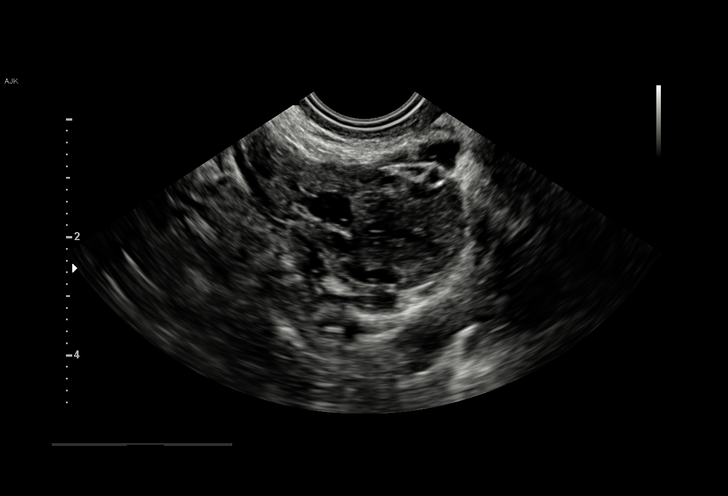
[im 31/37]
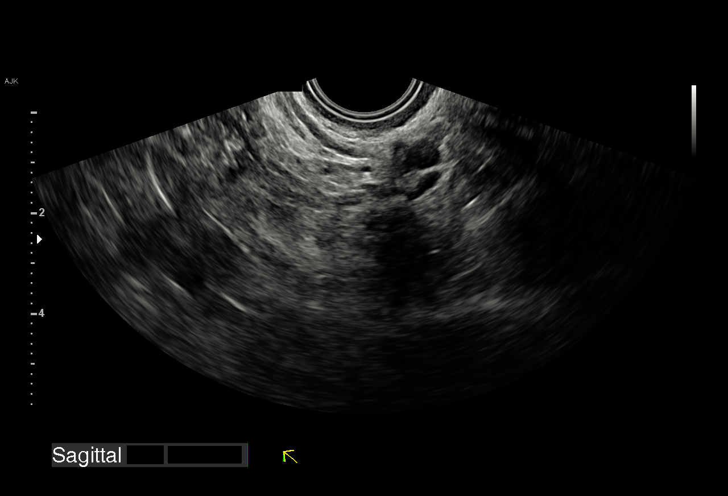
[im 34/37]
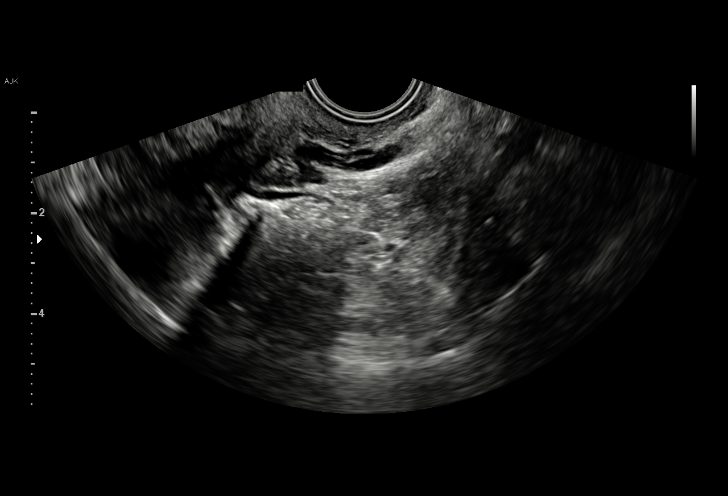
[im 37/37]
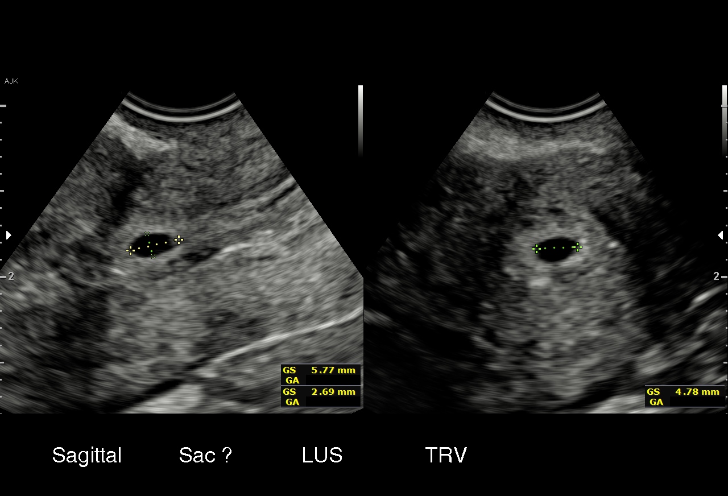

[15 of 28 positions shown; findings below may reference images not displayed]

FINDINGS: Intrauterine gestational sac: Sac-like structure in the lower
uterine segment without significant change in size.

Yolk sac:  Not seen

Embryo:  Not seen

MSD: 4.4 mm   5 w   1 d

Subchorionic hemorrhage:  None visualized.

Maternal uterus/adnexae: Ovaries are within normal limits. Right
ovary measures 2.5 x 1.6 x 1.7 cm. Left ovary measures 3 x 2 x
cm.
IMPRESSION: 1. Small sac-like structure in the lower uterine segment without
significant change in position. Previously suggested yolk sac is no
longer identified. No embryo identified.

## 2021-06-21 ENCOUNTER — Other Ambulatory Visit: Payer: Self-pay

## 2021-06-21 ENCOUNTER — Encounter (HOSPITAL_COMMUNITY): Payer: Self-pay | Admitting: Physician Assistant

## 2021-06-21 ENCOUNTER — Ambulatory Visit (INDEPENDENT_AMBULATORY_CARE_PROVIDER_SITE_OTHER): Payer: No Payment, Other | Admitting: Physician Assistant

## 2021-06-21 VITALS — BP 117/82 | HR 79 | Ht 61.0 in | Wt 205.0 lb

## 2021-06-21 DIAGNOSIS — F411 Generalized anxiety disorder: Secondary | ICD-10-CM

## 2021-06-21 DIAGNOSIS — F319 Bipolar disorder, unspecified: Secondary | ICD-10-CM

## 2021-06-21 MED ORDER — TRAZODONE HCL 100 MG PO TABS
100.0000 mg | ORAL_TABLET | Freq: Every day | ORAL | 0 refills | Status: DC
  Filled 2021-06-21: qty 30, 30d supply, fill #0

## 2021-06-21 MED ORDER — LAMOTRIGINE 25 MG PO TABS
25.0000 mg | ORAL_TABLET | Freq: Two times a day (BID) | ORAL | 0 refills | Status: DC
  Filled 2021-06-21: qty 60, 30d supply, fill #0

## 2021-06-21 NOTE — Progress Notes (Signed)
[facility]  06/21/2021 8:31 AM Vicki Ford  MRN:  [id number] Subjective: I am struggling with my mood.  I am not sleeping at night, having cleaning the kitchen excessively, doing other activities and my husband says I need to slow down  History of present illness: Patient is a 31 year old female who presents today for follow-up visit.  Patient reports that she has been having ups and downs with her mood, currently is on a high, has racing thoughts, increased activities, was unable to sleep for a few days, reports she slept the last 2 nights for about 3 hours.  Patient denies any risk-taking behaviors but reports that her husband helps her at home, keeps a track on her and that is her relieving factor.  She reports that she also lost her mother recently, which is an aggravating factor adds that the stress of having 3 kids at home, not being employed and wants her mood to be stable.  Patient denies any suicidal thoughts, reports she had thoughts for a few days which were fleeting in nature over the past 2 weeks, no intent or plan.  She has that her suicide attempt was in 2018 and she got admitted at that time.  She reports that she has a good support system with her husband, misses her mom and knows that she needs to see a therapist.  Patient denies any side effects with the medications, any other concerns at this visit.  Discussed with patient the need to discontinue Prozac and increase her mood stabilizer along with trazodone to help her sleep at night.  Also discussed the urgent care being open 24/7 if she felt ever she was in crisis     Total Time spent with patient: 20 minutes  Past Psychiatric History: Patient has a history of depression in the past with a suicide attempt.  Her current symptomatology since she has been at this clinic shows patient to have bipolar disorder as she has a lot of ups and downs in her mood.  Past Medical History:  Past Medical History:  Diagnosis Date    Anxiety    takes zoloft, buspar, & atarax   Asthma    sports induced asthma   Depression    takes zoloft & buspar   Headache    Preterm labor     Past Surgical History:  Procedure Laterality Date   CESAREAN SECTION     GALLBLADDER SURGERY  01/15/2019   HERNIA REPAIR     TUBAL LIGATION N/A 03/26/2020   Procedure: POST PARTUM TUBAL LIGATION;  Surgeon: Levie HeritageStinson, Jacob J, DO;  Location: [facility] LD ORS;  Service: Gynecology;  Laterality: N/A;   Family History:  Family History  Problem Relation Age of Onset   Cancer Mother    Depression Mother    ADD / ADHD Sister    Depression Sister    Family Psychiatric  History: None reported Social History:  Social History   Substance and Sexual Activity  Alcohol Use Not Currently     Social History   Substance and Sexual Activity  Drug Use Not Currently   Comment: as a teen    Social History   Socioeconomic History   Marital status: Married    Spouse name: Not on file   Number of children: Not on file   Years of education: Not on file   Highest education level: Not on file  Occupational History   Not on file  Tobacco Use   Smoking status: Former  Types: Cigarettes    Quit date: 03/30/2019    Years since quitting: 2.2   Smokeless tobacco: Never   Tobacco comments:    not since pregnancy  Vaping Use   Vaping Use: Former  Substance and Sexual Activity   Alcohol use: Not Currently   Drug use: Not Currently    Comment: as a teen   Sexual activity: Yes  Other Topics Concern   Not on file  Social History Narrative   Not on file   Social Determinants of Health   Financial Resource Strain: Not on file  Food Insecurity: Not on file  Transportation Needs: Not on file  Physical Activity: Not on file  Stress: Not on file  Social Connections: Not on file   Additional Social History:      Patient is currently unemployed, has 3 kids at home, is married and husband is supportive                  Sleep:  Poor  Appetite:  Poor  Current Medications: Current Outpatient Medications  Medication Sig Dispense Refill   Ascorbic Acid (VITAMIN C WITH ROSE HIPS) 1000 MG tablet Take 1,000 mg by mouth daily.     hydrOXYzine (ATARAX/VISTARIL) 25 MG tablet Take 1 tablet (25 mg total) by mouth every 6 (six) hours as needed for anxiety. 45 tablet 2   hydrOXYzine (VISTARIL) 25 MG capsule Take 1 capsule (25 mg total) by mouth 3 (three) times daily as needed. 45 capsule 1   ibuprofen (ADVIL) 600 MG tablet TAKE 1 TABLET(600 MG) BY MOUTH EVERY 6 HOURS AS NEEDED 30 tablet 0   lamoTRIgine (LAMICTAL) 25 MG tablet Take 1 tablet (25 mg total) by mouth 2 (two) times daily. 60 tablet 0   oxyCODONE (OXY IR/ROXICODONE) 5 MG immediate release tablet Take 1 tablet (5 mg total) by mouth every 4 (four) hours as needed for severe pain or breakthrough pain. (Patient not taking: Reported on 04/29/2020) 10 tablet 0   Prenatal Vit-Fe Fumarate-FA (PRENATAL MULTIVITAMIN) TABS tablet Take 1 tablet by mouth daily at 12 noon. (Patient not taking: Reported on 11/28/2019)     traZODone (DESYREL) 100 MG tablet Take 1 tablet (100 mg total) by mouth at bedtime. 30 tablet 0   No current facility-administered medications for this visit.    Lab Results: No results found for this or any previous visit (from the past 48 hour(s)).  Blood Alcohol level:  No results found for: [facility]  Metabolic Disorder Labs: No results found for: HGBA1C, MPG No results found for: PROLACTIN No results found for: CHOL, TRIG, HDL, CHOLHDL, VLDL, LDLCALC  Physical Findings: AIMS:  , ,  ,  ,    CIWA:    COWS:     Musculoskeletal: Strength & Muscle Tone: within normal limits Gait & Station: normal Patient leans: N/A  Psychiatric Specialty Exam:  Psychiatric Specialty Exam: Physical Exam  Review of Systems  Constitutional: Negative.  Negative for chills, fever, malaise/fatigue and weight loss.  HENT: Negative.  Negative for ear discharge, ear pain, sinus  pain and sore throat.   Eyes: Negative.  Negative for blurred vision, double vision, discharge and redness.  Cardiovascular: Negative.  Negative for chest pain and palpitations.  Gastrointestinal: Negative.  Negative for abdominal pain, diarrhea, heartburn, nausea and vomiting.  Skin: Negative.   Neurological:  Positive for headaches. Negative for dizziness, tingling, seizures, loss of consciousness and weakness.       Patient reports history of migraines, takes Motrin when  she has a headache  Psychiatric/Behavioral:  Negative for depression, hallucinations, substance abuse and suicidal ideas. The patient has insomnia. The patient is not nervous/anxious.    Blood pressure 117/82, pulse 79, height 5\' 1"  (1.549 m), weight 205 lb (93 kg), currently breastfeeding.Body mass index is 38.73 kg/m.  General Appearance: Casual  Eye Contact:  Good  Speech:  Clear and Coherent and Normal Rate  Volume:  Normal  Mood:  Euphoric  Affect:  Non-Congruent  Thought Process:  Coherent, Goal Directed, and Descriptions of Associations: Intact  Orientation:  Full (Time, Place, and Person)  Thought Content:  WDL and Logical  Suicidal Thoughts:  No  Homicidal Thoughts:  No  Memory:  Immediate;   Fair Recent;   Fair Remote;   Fair  Judgement:  Fair  Insight:  Present  Psychomotor Activity:  Increased and Mannerisms  Concentration:  Concentration: Fair and Attention Span: Fair  Recall:  of Knowledge:  Fair  Language:  Fair  Akathisia:  No  Handed:  Right  AIMS (if indicated):     Assets:  Communication Skills Desire for Improvement Housing Intimacy Leisure Time Physical Health Social Support Talents/Skills Transportation  ADL's:  Intact  3 to 4 hours at night        Physical Exam: Physical Exam Review of Systems  Constitutional: Negative.  Negative for chills, fever, malaise/fatigue and weight loss.  HENT: Negative.  Negative for ear discharge, ear pain, sinus pain and sore  throat.   Eyes: Negative.  Negative for blurred vision, double vision, discharge and redness.  Cardiovascular: Negative.  Negative for chest pain and palpitations.  Gastrointestinal: Negative.  Negative for abdominal pain, diarrhea, heartburn, nausea and vomiting.  Skin: Negative.   Neurological:  Positive for headaches. Negative for dizziness, tingling, seizures, loss of consciousness and weakness.       Patient reports history of migraines, takes Motrin when she has a headache  Psychiatric/Behavioral:  Negative for depression, hallucinations, substance abuse and suicidal ideas. The patient has insomnia. The patient is not nervous/anxious.   Blood pressure 117/82, pulse 79, height 5\' 1"  (1.549 m), weight 205 lb (93 kg), currently breastfeeding. Body mass index is 38.73 kg/m.   Treatment Plan Summary: Medication management and Plan   Patient has a 31 year old female who presents today for follow-up visit.  Patient does have symptoms of hypomania, has been diagnosed with bipolar disorder type I.  Patient is not sleeping at night, has been doing excessive activities like cleaning has excessively, but no risk-taking behaviors.  Discussed discontinuing Prozac, increasing Lamictal to 25 mg twice daily and increasing trazodone 200 mg at night to help stabilize patient's mood and also her sleep.  To take Lamictal 25 mg twice daily for mood stabilization To take trazodone 100 mg at bedtime for sleep To start seeing a therapist on a regular basis to help with grief, helping patient in regards to her insight for the need to have her routine, schedule along with sleep and help with coping skills Call as needed, also discussed the behavioral health urgent care which is open 24/7 if patient feels she is in crisis Follow-up in 2 weeks Discussed in length care coordination with patient, patient states that she is doing well medically, when she needs to be seen, [facility]. , MD 06/21/2021, 8:31 AM

## 2021-06-22 ENCOUNTER — Other Ambulatory Visit: Payer: Self-pay

## 2021-06-23 ENCOUNTER — Other Ambulatory Visit: Payer: Self-pay

## 2021-07-06 ENCOUNTER — Other Ambulatory Visit: Payer: Self-pay

## 2021-07-06 ENCOUNTER — Ambulatory Visit (INDEPENDENT_AMBULATORY_CARE_PROVIDER_SITE_OTHER): Payer: No Payment, Other | Admitting: Physician Assistant

## 2021-07-06 ENCOUNTER — Encounter (HOSPITAL_COMMUNITY): Payer: Self-pay | Admitting: Physician Assistant

## 2021-07-06 VITALS — BP 117/84 | HR 77 | Ht 61.0 in | Wt 206.0 lb

## 2021-07-06 DIAGNOSIS — F0630 Mood disorder due to known physiological condition, unspecified: Secondary | ICD-10-CM | POA: Diagnosis not present

## 2021-07-06 DIAGNOSIS — F319 Bipolar disorder, unspecified: Secondary | ICD-10-CM | POA: Insufficient documentation

## 2021-07-06 DIAGNOSIS — F411 Generalized anxiety disorder: Secondary | ICD-10-CM

## 2021-07-06 MED ORDER — QUETIAPINE FUMARATE 50 MG PO TABS
50.0000 mg | ORAL_TABLET | Freq: Every day | ORAL | 2 refills | Status: DC
  Filled 2021-07-06 – 2021-09-26 (×2): qty 30, 30d supply, fill #0

## 2021-07-06 NOTE — Progress Notes (Signed)
BH MD/PA/NP OP Progress Note  07/06/2021 10:07 AM Vicki Ford  MRN:  [id number]  Chief Complaint:  Chief Complaint   Medication Management    HPI:   Vicki Ford is a 31 year old female with a past psychiatric history significant for bipolar 1 disorder, generalized anxiety disorder, and mood disorder who presents to [facility] for follow-up medication management.  Patient was last seen by Dr. Dwyane Dee Hampton Abbot, MD) on 06/21/2021.  Patient is currently being managed on the following medications:  Lamictal 25 mg 2 times daily Trazodone 200 mg at bedtime  Patient states that she was recently taken off of her previous antidepressant (Prozac 10 mg daily) cold [place] and her Lamictal was increased from 25 mg daily to 25 mg twice daily.  Patient was also placed on trazodone 200 mg at bedtime.  Patient states that the adjustments to her medications were made by previous provider for the stabilization of her mood.  Since her medication adjustments, patient states that she is angry all the time and has no verbal filter.  She states that she feels all over the place and describes her current disposition as "I want to set everything on fire and move to a remote island and forget everyone exist."  Patient endorses depression and has been experiencing the following symptoms: lack of motivation, decreased energy, feelings of guilt/worthlessness, and hopelessness.  Patient endorses anxiety and rates her anxiety at 10 out of 10.  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  A PHQ-9 screen was performed with the patient scoring a 26.  A GAD-7 screen was also performed with the patient scoring a 21.  A [place] Suicide Severity Rating Scale was utilized with the patient being considered at risk.  Patient states that she is not suicidal at this time.  Patient is alert and oriented x4, calm, cooperative, and fully engaged in conversation  during the encounter.  Patient endorses being in a horrible mood.  Patient denies suicidal or homicidal ideations.  She further denies auditory or visual hallucinations and does not appear to be responding to internal/external stimuli.  Patient states that her sleep has been all over the place and is sometimes unable to sleep.  Patient endorses decreased appetite and eats on average 1-1/2 meals per day.  Patient denies alcohol consumption and illicit drug use.  Patient endorses tobacco use in the form of hookah in order to de-stress.  Visit Diagnosis:    ICD-10-CM   1. Bipolar 1 disorder (HCC)  F31.9 QUEtiapine (SEROQUEL) 50 MG tablet    2. GAD (generalized anxiety disorder)  F41.1 QUEtiapine (SEROQUEL) 50 MG tablet    3. Mood disorder in conditions classified elsewhere  F06.30       Past Psychiatric History:  Bipolar 1 disorder Generalized anxiety disorder  Patient has a history of depression in the past with a suicide attempt.  Her current symptomatology since she has been at this clinic shows patient to have bipolar disorder as she has a lot of ups and downs in her mood.  Past Medical History:  Past Medical History:  Diagnosis Date   Anxiety    takes zoloft, buspar, & atarax   Asthma    sports induced asthma   Depression    takes zoloft & buspar   Headache    Preterm labor     Past Surgical History:  Procedure Laterality Date   CESAREAN SECTION     GALLBLADDER SURGERY  01/15/2019  HERNIA REPAIR     TUBAL LIGATION N/A 03/26/2020   Procedure: POST PARTUM TUBAL LIGATION;  Surgeon: Truett Mainland, DO;  Location: [facility] LD [facility];  Service: Gynecology;  Laterality: N/A;    Family Psychiatric History:  None reported  Family History:  Family History  Problem Relation Age of Onset   Cancer Mother    Depression Mother    ADD / ADHD Sister    Depression Sister     Social History:  Social History   Socioeconomic History   Marital status: Married    Spouse name: Not on file    Number of children: Not on file   Years of education: Not on file   Highest education level: Not on file  Occupational History   Not on file  Tobacco Use   Smoking status: Former    Types: Cigarettes    Quit date: 03/30/2019    Years since quitting: 2.2   Smokeless tobacco: Never   Tobacco comments:    not since pregnancy  Vaping Use   Vaping Use: Former  Substance and Sexual Activity   Alcohol use: Not Currently   Drug use: Not Currently    Comment: as a teen   Sexual activity: Yes  Other Topics Concern   Not on file  Social History Narrative   Not on file   Social Determinants of Health   Financial Resource Strain: Not on file  Food Insecurity: Not on file  Transportation Needs: Not on file  Physical Activity: Not on file  Stress: Not on file  Social Connections: Not on file    Allergies:  Allergies  Allergen Reactions   Liver Swelling    Metabolic Disorder Labs: No results found for: HGBA1C, MPG No results found for: PROLACTIN No results found for: CHOL, TRIG, HDL, CHOLHDL, VLDL, LDLCALC No results found for: TSH  Therapeutic Level Labs: No results found for: LITHIUM No results found for: VALPROATE No components found for:  CBMZ  Current Medications: Current Outpatient Medications  Medication Sig Dispense Refill   QUEtiapine (SEROQUEL) 50 MG tablet Take 1 tablet (50 mg total) by mouth at bedtime. 30 tablet 2   Ascorbic Acid (VITAMIN C WITH ROSE HIPS) 1000 MG tablet Take 1,000 mg by mouth daily.     hydrOXYzine (ATARAX/VISTARIL) 25 MG tablet Take 1 tablet (25 mg total) by mouth every 6 (six) hours as needed for anxiety. 45 tablet 2   hydrOXYzine (VISTARIL) 25 MG capsule Take 1 capsule (25 mg total) by mouth 3 (three) times daily as needed. 45 capsule 1   ibuprofen (ADVIL) 600 MG tablet TAKE 1 TABLET(600 MG) BY MOUTH EVERY 6 HOURS AS NEEDED 30 tablet 0   lamoTRIgine (LAMICTAL) 25 MG tablet Take 1 tablet (25 mg total) by mouth 2 (two) times daily. 60  tablet 0   oxyCODONE (OXY IR/ROXICODONE) 5 MG immediate release tablet Take 1 tablet (5 mg total) by mouth every 4 (four) hours as needed for severe pain or breakthrough pain. (Patient not taking: Reported on 04/29/2020) 10 tablet 0   Prenatal Vit-Fe Fumarate-FA (PRENATAL MULTIVITAMIN) TABS tablet Take 1 tablet by mouth daily at 12 noon. (Patient not taking: Reported on 11/28/2019)     traZODone (DESYREL) 100 MG tablet Take 1 tablet (100 mg total) by mouth at bedtime. 30 tablet 0   No current facility-administered medications for this visit.     Musculoskeletal: Strength & Muscle Tone: within normal limits Gait & Station: normal Patient leans: N/A  Psychiatric Specialty  Exam: Review of Systems  Psychiatric/Behavioral:  Positive for agitation, decreased concentration, dysphoric mood and sleep disturbance. Negative for hallucinations, self-injury and suicidal ideas. The patient is nervous/anxious. The patient is not hyperactive.    Blood pressure 117/84, pulse 77, height 5\' 1"  (1.549 m), weight 206 lb (93.4 kg), SpO2 100 %, currently breastfeeding.Body mass index is 38.92 kg/m.  General Appearance: Casual  Eye Contact:  Good  Speech:  Clear and Coherent and Normal Rate  Volume:  Normal  Mood:  Anxious, Depressed, and Dysphoric  Affect:  Congruent and Depressed  Thought Process:  Coherent, Goal Directed, and Descriptions of Associations: Intact  Orientation:  Full (Time, Place, and Person)  Thought Content: WDL   Suicidal Thoughts:  No  Homicidal Thoughts:  No  Memory:  Immediate;   Fair Recent;   Fair Remote;   Fair  Judgement:  Fair  Insight:  Present  Psychomotor Activity:  Normal  Concentration:  Concentration: Fair and Attention Span: Fair  Recall:  [employer] of Knowledge: Fair  Language: Fair  Akathisia:  No  Handed:  Right  AIMS (if indicated): not done  Assets:  Communication Skills Desire for Improvement Housing Intimacy Leisure Time Physical Health Social  Support Talents/Skills Transportation  ADL's:  Intact  Cognition: WNL  Sleep:  Poor   Screenings: GAD-7    Flowsheet Row Clinical Support from 07/06/2021 in [facility] from 03/02/2021 in [place] from 04/14/2020 in [facility] [place] [facility]  Total GAD-7 Score 21 16 15       PHQ2-9    Flowsheet Row Clinical Support from 07/06/2021 in [place] from 06/21/2021 in [facility] Counselor from 03/02/2021 in [place] from 04/14/2020 in [facility] [place] [facility]  PHQ-2 Total Score 5 0 5 4  PHQ-9 Total Score 26 -- 20 12      [place] from 07/06/2021 in [place] from 06/21/2021 in [facility] Counselor from 03/02/2021 in [place] CATEGORY Moderate Risk Error: Q3, 4, or 5 should not be populated when Q2 is No No Risk        Assessment and Plan:   Orville Bell is a 31 year old female with a past psychiatric history significant for bipolar 1 disorder, generalized anxiety disorder, and mood disorder who presents to [facility] for follow-up medication management.  Patient was last seen by Dr. Dwyane Dee Hampton Abbot, MD) on 06/21/2021.  Patient reports that she has been exhibiting worse mood after recent changes from her medications.  Patient states that she has been on a number of antidepressants in the past as well as Rexulti.  Patient was recommended Seroquel 50 mg at bedtime for the management of her depressive symptoms and for mood stabilization.  Patient was agreeable to recommendation.  Patient was encouraged to  continue taking her other medications as prescribed.  Patient's medication to be e-prescribed to pharmacy of choice.  1. Bipolar 1 disorder ([place]) Patient to continue taking lamotrigine 25 mg 2 times daily for mood stabilization  - QUEtiapine (SEROQUEL) 50 MG tablet; Take 1 tablet (50 mg total) by mouth at bedtime.  Dispense: 30 tablet; Refill: 2  2. GAD (generalized anxiety disorder)  - QUEtiapine (SEROQUEL)  50 MG tablet; Take 1 tablet (50 mg total) by mouth at bedtime.  Dispense: 30 tablet; Refill: 2  3. Mood disorder in conditions classified elsewhere  Patient to follow in 6 weeks Provider spent a total of 20 minutes with the patient/reviewing the patient's chart  Malachy Mood, PA 07/06/2021, 10:07 AM

## 2021-07-12 ENCOUNTER — Telehealth (HOSPITAL_COMMUNITY): Payer: Self-pay | Admitting: *Deleted

## 2021-07-12 ENCOUNTER — Other Ambulatory Visit (HOSPITAL_COMMUNITY): Payer: Self-pay | Admitting: Family

## 2021-07-12 NOTE — Telephone Encounter (Signed)
Faxed request from [facility] pharmacy for rx for Lamotrigine. Looks liek she should be out the end of this month.

## 2021-07-13 ENCOUNTER — Other Ambulatory Visit: Payer: Self-pay

## 2021-08-22 ENCOUNTER — Other Ambulatory Visit: Payer: Self-pay

## 2021-08-22 ENCOUNTER — Ambulatory Visit (INDEPENDENT_AMBULATORY_CARE_PROVIDER_SITE_OTHER): Payer: No Payment, Other | Admitting: Clinical

## 2021-08-22 DIAGNOSIS — F319 Bipolar disorder, unspecified: Secondary | ICD-10-CM | POA: Diagnosis not present

## 2021-08-22 NOTE — Progress Notes (Signed)
? ?THERAPIST PROGRESS NOTE ? ?Session Time: 45 minutes ? ?Participation Level: Active ? ?Behavioral Response: CasualAlertDepressed ? ?Type of Therapy: Individual Therapy ? ?Treatment Goals addressed: identify cognitive patterns and beliefs that interfere with therapy ? ?ProgressTowards Goals: Not Progressing ? ?Interventions: CBT and Supportive ? ?Summary:  ?Vicki Ford is a 31 y.o. female who presents for the scheduled session oriented times five, appropriately dressed, and friendly. Client denied hallucinations and delusions. ?Client reported on today she has been having a mix of depressive and anxiety symptoms. Client reported experiencing a mix of depressive and manic symptoms. Client reported she has met with the psychiatrist for medication management and has been going through trial and error. Client reported she still feels off. Client reported she either feels overly tired or she has excessive amount of energy. Client reported she has been feeling low since yesterday. Client reported not wanting to get out of bed. Client reported she reported passive suicidal ideation without plan or intent to her husband. Client reported she gets tired of trying different medications. Client reported she has identifiable stressors and triggers related to family. Client reported her 36 year old niece has run away and she is trying to help keep everyone calm in the mean time. Client reported they have filed a police report but they said due to short staffing she may hear from them every 2 to 3 days. Client reported she has little support with helping her to feel balanced in supporting her with symptoms of depression and managing daily activity. Client reported she didn't have the best childhood but her mother did what she could with what was available. Client reported she has tried to not be like her mother and give positive reinforcement to everyone else but tends to feel alone because others don't understand what she  experiences. Client reported reading and listening and music are usually her favorite coping activities.  ?Evidence of progress towards goal:  client reported she has been medication compliant 7 out of 7 days per week. Client reported she is recording her reactions to her medication regiment to speak with her psychiatrist about at her medication next visit. Client is able to identify at least 2 triggers for her depressive episodes. ? ? ?Suicidal/Homicidal: Nowithout intent/plan ? ?Therapist Response:  ?Therapist began the appointment asking the client how she has been doing since last seen. ?Therapist used CBT to engage using active listening and positive emotional support towards her thoughts and feelings. ?Therapist used CBT to have her identify her response to her medication regimen and compliance with taking it as prescribed. ?Therapist used CBT to have the client to identify triggers of depressive episodes and behaviors as it relates to childhood experiences and/or other triggers in her environment. ?Therapist used CBT to help the client to explain her regimen of grooming and hygiene, sleeping patterns, and appetite. ?Therapist used CBT to ask the client to report suicidal ideations and steps to take for safety planning and provide information for emergency services contact information. ?Therapist used CBT to discuss and teach the client about self compassion and normalizing emotional reactions to help increase her strength of replacing distorted negative thinking. ?Therapist used CBT ask the client to identify her progress with frequency of use with coping skills with continued practice in her daily activity.    ?Therapist assigned to client homework to get a journal to read about her ideal breast abrasion of herself and to watch Fraser Din talk or educational videos about self compassion. ?Client was scheduled for next  appointment. ? ? ? ? ?Plan: Return again in 5 weeks. ? ?Diagnosis: Bipolar 1  disorder ? ?Collaboration of Care: Patient refused AEB none requested by the client at this time. Therapist will follow up with the clients psychiatrist about her concerns with medication management. ? ?Patient/Guardian was advised Release of Information must be obtained prior to any record release in order to collaborate their care with an outside provider. Patient/Guardian was advised if they have not already done so to contact the registration department to sign all necessary forms in order for [place] to release information regarding their care.  ? ?Consent: Patient/Guardian gives verbal consent for treatment and assignment of benefits for services provided during this visit. Patient/Guardian expressed understanding and agreed to proceed.  ? ?Birdena Jubilee Daril Warga, LCSW ?08/22/2021 ? ?

## 2021-08-22 NOTE — Plan of Care (Signed)
Client was in agreement with the treatment plan. °

## 2021-08-24 ENCOUNTER — Encounter (HOSPITAL_COMMUNITY): Payer: No Payment, Other | Admitting: Physician Assistant

## 2021-09-08 ENCOUNTER — Telehealth (HOSPITAL_COMMUNITY): Payer: Self-pay | Admitting: Physician Assistant

## 2021-09-08 NOTE — Telephone Encounter (Signed)
Patient rescheduled appt 4/4 to 10/25/21.  Pt asking if she is ok to continue medications as prescribed until appt 10/25/21.  Please advise [phone]

## 2021-09-09 NOTE — Telephone Encounter (Signed)
Provider was able to contact patient to discuss her medications.  Patient reports that she has only been taking trazodone and hydroxyzine and has not yet started Seroquel.  She reports tiptoeing between manic and depressive symptoms.  She reports that she finds it difficult to get out of bed and experiences decreased focus.  Patient reports that she has been under a lot of stress related to family as of late.  She reports that she may come through next week during providers walk-in hours.  Provider advised patient to pick up Seroquel prescription to see if her mood improves.  Patient's next scheduled appointment is scheduled for 10/25/2021.

## 2021-09-26 ENCOUNTER — Other Ambulatory Visit (HOSPITAL_COMMUNITY): Payer: Self-pay | Admitting: Physician Assistant

## 2021-09-26 ENCOUNTER — Telehealth (HOSPITAL_COMMUNITY): Payer: Self-pay | Admitting: *Deleted

## 2021-09-26 ENCOUNTER — Other Ambulatory Visit: Payer: Self-pay

## 2021-09-26 DIAGNOSIS — F319 Bipolar disorder, unspecified: Secondary | ICD-10-CM

## 2021-09-26 DIAGNOSIS — F0630 Mood disorder due to known physiological condition, unspecified: Secondary | ICD-10-CM

## 2021-09-26 MED ORDER — LAMOTRIGINE 25 MG PO TABS
25.0000 mg | ORAL_TABLET | Freq: Two times a day (BID) | ORAL | 0 refills | Status: DC
  Filled 2021-09-26: qty 60, 30d supply, fill #0

## 2021-09-26 NOTE — Progress Notes (Signed)
Provider was contacted by Direce E. McIntyre regarding patient's medication. Patient missed her last scheduled follow up appointment and has been issued a new follow up appointment scheduled for 10/25/2021. Provider to bridge patient's medication until scheduled follow up appointment.

## 2021-09-26 NOTE — Telephone Encounter (Signed)
Provider was contacted by Vicki Ford regarding patient's medication. Patient missed her last scheduled follow up appointment and has been issued a new follow up appointment scheduled for 10/25/2021. Provider to bridge patient's medication until scheduled follow up appointment.

## 2021-09-26 NOTE — Telephone Encounter (Signed)
PATIENT CALLED FOR REFILL ON  ?lamoTRIgine (LAMICTAL) 25 MG tablet ?Take 1 tablet (25 mg total) by mouth 2 (two) times daily  ? ?PATIENT MISSED APPOINTMENT ON  08/24/21 && IS SCHEDULED FOR NEXT APPOINTMENT ON 10/25/21. ? ?PLEASE BRIDGE 30 DAY SUPPLY UNTIL APPOINTMENT ?

## 2021-09-28 ENCOUNTER — Other Ambulatory Visit: Payer: Self-pay

## 2021-09-28 ENCOUNTER — Ambulatory Visit (INDEPENDENT_AMBULATORY_CARE_PROVIDER_SITE_OTHER): Payer: No Payment, Other | Admitting: Physician Assistant

## 2021-09-28 ENCOUNTER — Encounter (HOSPITAL_COMMUNITY): Payer: Self-pay | Admitting: Physician Assistant

## 2021-09-28 DIAGNOSIS — F411 Generalized anxiety disorder: Secondary | ICD-10-CM

## 2021-09-28 DIAGNOSIS — F319 Bipolar disorder, unspecified: Secondary | ICD-10-CM | POA: Diagnosis not present

## 2021-09-28 DIAGNOSIS — F0630 Mood disorder due to known physiological condition, unspecified: Secondary | ICD-10-CM

## 2021-09-28 MED ORDER — LAMOTRIGINE 100 MG PO TABS
100.0000 mg | ORAL_TABLET | Freq: Every day | ORAL | 1 refills | Status: DC
  Filled 2021-09-28: qty 30, 30d supply, fill #0

## 2021-09-28 MED ORDER — GABAPENTIN 100 MG PO CAPS
100.0000 mg | ORAL_CAPSULE | Freq: Three times a day (TID) | ORAL | 1 refills | Status: DC
  Filled 2021-09-28: qty 90, 30d supply, fill #0

## 2021-09-28 MED ORDER — QUETIAPINE FUMARATE 100 MG PO TABS
100.0000 mg | ORAL_TABLET | Freq: Every day | ORAL | 1 refills | Status: DC
  Filled 2021-09-28: qty 30, 30d supply, fill #0

## 2021-09-28 NOTE — Progress Notes (Addendum)
BH MD/PA/NP OP Progress Note ? ?09/28/2021 8:22 PM ?[name]  ?MRN:  [id number] ? ?Chief Complaint:  ?Chief Complaint   ?WALK-IN ?  ? ?HPI:  ? ?Vicki Ford is a 31 year old female with a past psychiatric history significant for bipolar 1 disorder, generalized anxiety disorder, and mood disorder who [facility] for follow-up medication management.  Patient is currently being managed on the following medications: ? ?Lamictal 25 mg 2 times daily ?Seroquel 50 mg at bedtime ?Hydroxyzine 25 mg 3 times daily as needed ? ?Patient reports that she feels that her body has adjusted well to the medication doses prescribed to her.  Patient states that she is also trying to exercise more to help improve her mood.  Patient reports exercising more than 3 times a week but states that she still has to exert some effort in order to get herself out of bed.  Patient endorses anxiety and does not feel like her hydroxyzine has not been kicking him as quickly.  Patient reports that she has been on buspirone in the past, but it did not do much to help with her anxiety.  Since taking Seroquel, patient states that she feels less groggy and is able to sleep better.  Although patient is exerting more effort, she still endorses lack of motivation and still trying to find the want in things.  A PHQ-9 screen was performed the patient scoring a 21.  A GAD-7 screen was also performed with the patient scoring a 21. ? ?Patient is alert and oriented x4, calm, cooperative, and fully engaged in conversation during the encounter.  Patient describes her mood as having 12 different feelings on her mind being all over the place.  Patient states that she feels like she is talking with 12 other people cannot come to terms with her self.  She denies suicidal or homicidal ideations.  She further denies auditory or visual hallucinations and does not appear to be responding to internal/external stimuli.   Patient endorses good sleep and receives on average several hours of sleep each night.  Patient endorses fair appetite and eats on average 2 meals per day.  Patient states that she has been trying to eat more portion meals.  Patient denies alcohol consumption, tobacco use, and illicit drug use. ? ?Visit Diagnosis:  ?  ICD-10-CM   ?1. Bipolar 1 disorder (HCC)  F31.9 QUEtiapine (SEROQUEL) 100 MG tablet  ?  lamoTRIgine (LAMICTAL) 100 MG tablet  ?  ?2. GAD (generalized anxiety disorder)  F41.1 gabapentin (NEURONTIN) 100 MG capsule  ?  QUEtiapine (SEROQUEL) 100 MG tablet  ?  ?3. Mood disorder in conditions classified elsewhere  F06.30 lamoTRIgine (LAMICTAL) 100 MG tablet  ?  ? ? ?Past Psychiatric History:  ?Bipolar 1 disorder ?Generalized anxiety disorder ? ?Patient has a history of depression in the past with a suicide attempt.  Her current symptomatology since she has been at this clinic shows patient to have bipolar disorder as she has a lot of ups and downs in her mood. ? ?Past Medical History:  ?Past Medical History:  ?Diagnosis Date  ? Anxiety   ? takes zoloft, buspar, & atarax  ? Asthma   ? sports induced asthma  ? Depression   ? takes zoloft & buspar  ? Headache   ? Preterm labor   ?  ?Past Surgical History:  ?Procedure Laterality Date  ? CESAREAN SECTION    ? GALLBLADDER SURGERY  01/15/2019  ? HERNIA REPAIR    ?  TUBAL LIGATION N/A 03/26/2020  ? Procedure: POST PARTUM TUBAL LIGATION;  Surgeon: Levie Heritage, DO;  Location: [facility];  Service: Gynecology;  Laterality: N/A;  ? ? ?Family Psychiatric History:  ?None reported ? ?Family History:  ?Family History  ?Problem Relation Age of Onset  ? Cancer Mother   ? Depression Mother   ? ADD / ADHD Sister   ? Depression Sister   ? ? ?Social History:  ?Social History  ? ?Socioeconomic History  ? Marital status: Married  ?  Spouse name: Not on file  ? Number of children: Not on file  ? Years of education: Not on file  ? Highest education level: Not on file   ?Occupational History  ? Not on file  ?Tobacco Use  ? Smoking status: Former  ?  Types: Cigarettes  ?  Quit date: 03/30/2019  ?  Years since quitting: 2.5  ? Smokeless tobacco: Never  ? Tobacco comments:  ?  not since pregnancy  ?Vaping Use  ? Vaping Use: Former  ?Substance and Sexual Activity  ? Alcohol use: Not Currently  ? Drug use: Not Currently  ?  Comment: as a teen  ? Sexual activity: Yes  ?Other Topics Concern  ? Not on file  ?Social History Narrative  ? Not on file  ? ?Social Determinants of Health  ? ?Financial Resource Strain: Not on file  ?Food Insecurity: Not on file  ?Transportation Needs: Not on file  ?Physical Activity: Not on file  ?Stress: Not on file  ?Social Connections: Not on file  ? ? ?Allergies:  ?Allergies  ?Allergen Reactions  ? Liver Swelling  ? ? ?Metabolic Disorder Labs: ?No results found for: HGBA1C, MPG ?No results found for: PROLACTIN ?No results found for: CHOL, TRIG, HDL, CHOLHDL, VLDL, LDLCALC ?No results found for: TSH ? ?Therapeutic Level Labs: ?No results found for: LITHIUM ?No results found for: VALPROATE ?No components found for:  CBMZ ? ?Current Medications: ?Current Outpatient Medications  ?Medication Sig Dispense Refill  ? Ascorbic Acid (VITAMIN C WITH ROSE HIPS) 1000 MG tablet Take 1,000 mg by mouth daily.    ? gabapentin (NEURONTIN) 100 MG capsule Take 1 capsule (100 mg total) by mouth 3 (three) times daily. 90 capsule 1  ? hydrOXYzine (ATARAX/VISTARIL) 25 MG tablet Take 1 tablet (25 mg total) by mouth every 6 (six) hours as needed for anxiety. 45 tablet 2  ? hydrOXYzine (VISTARIL) 25 MG capsule Take 1 capsule (25 mg total) by mouth 3 (three) times daily as needed. 45 capsule 1  ? oxyCODONE (OXY IR/ROXICODONE) 5 MG immediate release tablet Take 1 tablet (5 mg total) by mouth every 4 (four) hours as needed for severe pain or breakthrough pain. 10 tablet 0  ? Prenatal Vit-Fe Fumarate-FA (PRENATAL MULTIVITAMIN) TABS tablet Take 1 tablet by mouth daily at 12 noon.    ?  ibuprofen (ADVIL) 600 MG tablet TAKE 1 TABLET(600 MG) BY MOUTH EVERY 6 HOURS AS NEEDED 30 tablet 0  ? lamoTRIgine (LAMICTAL) 100 MG tablet Take 1 tablet (100 mg total) by mouth daily. 30 tablet 1  ? QUEtiapine (SEROQUEL) 100 MG tablet Take 1 tablet (100 mg total) by mouth at bedtime. 30 tablet 1  ? traZODone (DESYREL) 100 MG tablet Take 1 tablet (100 mg total) by mouth at bedtime. 30 tablet 0  ? ?No current facility-administered medications for this visit.  ? ? ? ?Musculoskeletal: ?Strength & Muscle Tone: within normal limits ?Gait & Station: normal ?Patient leans: N/A ? ?Psychiatric  Specialty Exam: ?Review of Systems  ?Psychiatric/Behavioral:  Positive for decreased concentration and dysphoric mood. Negative for hallucinations, self-injury, sleep disturbance and suicidal ideas. The patient is nervous/anxious. The patient is not hyperactive.    ?Blood pressure 137/83, pulse 78, height 5\' 1"  (1.549 m), weight 207 lb (93.9 kg), currently breastfeeding.Body mass index is 39.11 kg/m?.  ?General Appearance: Casual  ?Eye Contact:  Good  ?Speech:  Clear and Coherent and Normal Rate  ?Volume:  Normal  ?Mood:  Anxious and Depressed  ?Affect:  Congruent and Depressed  ?Thought Process:  Coherent, Goal Directed, and Descriptions of Associations: Intact  ?Orientation:  Full (Time, Place, and Person)  ?Thought Content: WDL   ?Suicidal Thoughts:  No  ?Homicidal Thoughts:  No  ?Memory:  Immediate;   Fair ?Recent;   Fair ?Remote;   Fair  ?Judgement:  Fair  ?Insight:  Present  ?Psychomotor Activity:  Normal  ?Concentration:  Concentration: Fair and Attention Span: Fair  ?Recall:  Good  ?Fund of Knowledge: Fair  ?Language: Good  ?Akathisia:  No  ?Handed:  Right  ?AIMS (if indicated): not done  ?Assets:  Communication Skills ?Desire for Improvement ?Housing ?Intimacy ?Leisure Time ?Physical Health ?Social Support ?Talents/Skills ?Transportation  ?ADL's:  Intact  ?Cognition: WNL  ?Sleep:  Good  ? ?Screenings: ?GAD-7   ? ?Flowsheet Row  Clinical Support from 09/28/2021 in [facility] Clinical Support from 07/06/2021 in [facility] Counselor from 03/02/2021 in [facility] Integ

## 2021-10-01 ENCOUNTER — Encounter (HOSPITAL_COMMUNITY): Payer: Self-pay | Admitting: Physician Assistant

## 2021-10-20 ENCOUNTER — Ambulatory Visit (HOSPITAL_COMMUNITY): Payer: No Payment, Other | Admitting: Clinical

## 2021-10-25 ENCOUNTER — Other Ambulatory Visit: Payer: Self-pay

## 2021-10-25 ENCOUNTER — Telehealth (INDEPENDENT_AMBULATORY_CARE_PROVIDER_SITE_OTHER): Payer: No Payment, Other | Admitting: Physician Assistant

## 2021-10-25 ENCOUNTER — Other Ambulatory Visit (HOSPITAL_COMMUNITY): Payer: Self-pay | Admitting: Physician Assistant

## 2021-10-25 ENCOUNTER — Encounter (HOSPITAL_COMMUNITY): Payer: Self-pay | Admitting: Physician Assistant

## 2021-10-25 DIAGNOSIS — F0630 Mood disorder due to known physiological condition, unspecified: Secondary | ICD-10-CM | POA: Diagnosis not present

## 2021-10-25 DIAGNOSIS — R41840 Attention and concentration deficit: Secondary | ICD-10-CM

## 2021-10-25 DIAGNOSIS — F319 Bipolar disorder, unspecified: Secondary | ICD-10-CM | POA: Diagnosis not present

## 2021-10-25 DIAGNOSIS — F411 Generalized anxiety disorder: Secondary | ICD-10-CM

## 2021-10-25 MED ORDER — ATOMOXETINE HCL 40 MG PO CAPS
40.0000 mg | ORAL_CAPSULE | Freq: Every day | ORAL | 1 refills | Status: DC
  Filled 2021-10-25: qty 30, 30d supply, fill #0
  Filled 2021-12-06 – 2021-12-17 (×3): qty 30, 30d supply, fill #1

## 2021-10-25 MED ORDER — LAMOTRIGINE 100 MG PO TABS
100.0000 mg | ORAL_TABLET | Freq: Every day | ORAL | 1 refills | Status: DC
  Filled 2021-10-25: qty 30, 30d supply, fill #0
  Filled 2021-12-17: qty 30, 30d supply, fill #1

## 2021-10-25 MED ORDER — GABAPENTIN 100 MG PO CAPS
100.0000 mg | ORAL_CAPSULE | Freq: Three times a day (TID) | ORAL | 1 refills | Status: DC
  Filled 2021-10-25 (×2): qty 90, 30d supply, fill #0
  Filled 2021-12-17: qty 90, 30d supply, fill #1

## 2021-10-25 MED ORDER — QUETIAPINE FUMARATE 50 MG PO TABS
50.0000 mg | ORAL_TABLET | Freq: Every day | ORAL | 1 refills | Status: DC
  Filled 2021-10-25: qty 30, 30d supply, fill #0
  Filled 2021-12-17: qty 30, 30d supply, fill #1

## 2021-10-25 NOTE — Progress Notes (Signed)
BH MD/PA/NP OP Progress Note  Virtual Visit via Video Note  I connected with Vicki Ford on 10/25/21 at  9:30 AM EDT by a video enabled telemedicine application and verified that I am speaking with the correct person using two identifiers.  Location: Patient: Home Provider: Clinic   I discussed the limitations of evaluation and management by telemedicine and the availability of in person appointments. The patient expressed understanding and agreed to proceed.  Follow Up Instructions:  I discussed the assessment and treatment plan with the patient. The patient was provided an opportunity to ask questions and all were answered. The patient agreed with the plan and demonstrated an understanding of the instructions.   The patient was advised to call back or seek an in-person evaluation if the symptoms worsen or if the condition fails to improve as anticipated.  I provided 16 minutes of non-face-to-face time during this encounter.  Meta Hatchet, PA   10/25/2021 9:30 AM Shavawn Stobaugh  MRN:  [id number]  Chief Complaint:  Chief Complaint   Follow-up; Medication Management    HPI:   Vicki Ford is a 31 year old female with a past psychiatric history significant for bipolar 1 disorder, generalized anxiety disorder, and mood disorder who presents to [facility] via virtual video for follow-up medication management.  Patient is currently being managed on the following medications:  Lamictal 100 mg daily Seroquel 100 mg at bedtime Gabapentin 100 mg 3 times daily  Patient reports that things have been a little better since the last encounter.  She reports that she has been able to keep her manic and depressive symptoms down to a minimum.  Patient states that she receives good sleep when taking her Seroquel but states that she often finds herself sleeping for long periods of time.  Patient states that she has relegated to taking her Seroquel on the  weekends when she knows she has a longer period of time to sleep.  Provider explained to patient that it is imperative for her to continue to take the medication daily.  Provider recommended decreasing the dosage of Seroquel from 100 mg at bedtime to 50 mg at bedtime.  Patient vocalized understanding.  Patient continues to endorse depression but states that goes away as fast that it resurfaced.  Patient states that her anxiety is not as severe as it before.  Patient states that there are times where her anxiety is really bad even hours after taking her gabapentin but then it slowly dissipate over time.  Patient expresses that she continues to have issues with her concentration and attention.  She reports forgetfulness, avoiding activities that require mental exertion, difficulty time managing, rereading material over and over to retain information, and impatience.  Patient denies any new stressors at this time.  A PHQ-9 screen was performed with the patient scoring a 17.  A GAD-7 screen was also performed with the patient scoring a 17.  Patient is alert and oriented x4, pleasant, calm, cooperative, and fully engaged in conversation during the encounter.  Patient describes a her mood as being chill.  Patient denies suicidal or homicidal ideations.  She further denies auditory or visual hallucinations and does not appear to be responding to internal/external stimuli.  Patient endorses fair sleep and receives on average 5 hours of sleep each night.  Patient endorses decreased appetite stating that she does not eat as much as she used to.  Patient endorses having 1 big meal and 2 snacks per day.  Patient endorses alcohol consumption sparingly.  Patient also endorses tobacco use sparingly, especially when around family.  Patient denies illicit drug use.  Visit Diagnosis:  No diagnosis found.   Past Psychiatric History:  Bipolar 1 disorder Generalized anxiety disorder  Patient has a history of depression in  the past with a suicide attempt.  Her current symptomatology since she has been at this clinic shows patient to have bipolar disorder as she has a lot of ups and downs in her mood.  Past Medical History:  Past Medical History:  Diagnosis Date   Anxiety    takes zoloft, buspar, & atarax   Asthma    sports induced asthma   Depression    takes zoloft & buspar   Headache    Preterm labor     Past Surgical History:  Procedure Laterality Date   CESAREAN SECTION     GALLBLADDER SURGERY  01/15/2019   HERNIA REPAIR     TUBAL LIGATION N/A 03/26/2020   Procedure: POST PARTUM TUBAL LIGATION;  Surgeon: Levie Heritage, DO;  Location: [facility] LD ORS;  Service: Gynecology;  Laterality: N/A;    Family Psychiatric History:  None reported  Family History:  Family History  Problem Relation Age of Onset   Cancer Mother    Depression Mother    ADD / ADHD Sister    Depression Sister     Social History:  Social History   Socioeconomic History   Marital status: Married    Spouse name: Not on file   Number of children: Not on file   Years of education: Not on file   Highest education level: Not on file  Occupational History   Not on file  Tobacco Use   Smoking status: Former    Types: Cigarettes    Quit date: 03/30/2019    Years since quitting: 2.5   Smokeless tobacco: Never   Tobacco comments:    not since pregnancy  Vaping Use   Vaping Use: Former  Substance and Sexual Activity   Alcohol use: Not Currently   Drug use: Not Currently    Comment: as a teen   Sexual activity: Yes  Other Topics Concern   Not on file  Social History Narrative   Not on file   Social Determinants of Health   Financial Resource Strain: Not on file  Food Insecurity: Not on file  Transportation Needs: Not on file  Physical Activity: Not on file  Stress: Not on file  Social Connections: Not on file    Allergies:  Allergies  Allergen Reactions   Liver Swelling    Metabolic Disorder Labs: No  results found for: HGBA1C, MPG No results found for: PROLACTIN No results found for: CHOL, TRIG, HDL, CHOLHDL, VLDL, LDLCALC No results found for: TSH  Therapeutic Level Labs: No results found for: LITHIUM No results found for: VALPROATE No components found for:  CBMZ  Current Medications: Current Outpatient Medications  Medication Sig Dispense Refill   Ascorbic Acid (VITAMIN C WITH ROSE HIPS) 1000 MG tablet Take 1,000 mg by mouth daily.     gabapentin (NEURONTIN) 100 MG capsule Take 1 capsule (100 mg total) by mouth 3 (three) times daily. 90 capsule 1   hydrOXYzine (ATARAX/VISTARIL) 25 MG tablet Take 1 tablet (25 mg total) by mouth every 6 (six) hours as needed for anxiety. 45 tablet 2   hydrOXYzine (VISTARIL) 25 MG capsule Take 1 capsule (25 mg total) by mouth 3 (three) times daily as needed. 45 capsule  1   ibuprofen (ADVIL) 600 MG tablet TAKE 1 TABLET(600 MG) BY MOUTH EVERY 6 HOURS AS NEEDED 30 tablet 0   lamoTRIgine (LAMICTAL) 100 MG tablet Take 1 tablet (100 mg total) by mouth daily. 30 tablet 1   oxyCODONE (OXY IR/ROXICODONE) 5 MG immediate release tablet Take 1 tablet (5 mg total) by mouth every 4 (four) hours as needed for severe pain or breakthrough pain. 10 tablet 0   Prenatal Vit-Fe Fumarate-FA (PRENATAL MULTIVITAMIN) TABS tablet Take 1 tablet by mouth daily at 12 noon.     QUEtiapine (SEROQUEL) 100 MG tablet Take 1 tablet (100 mg total) by mouth at bedtime. 30 tablet 1   traZODone (DESYREL) 100 MG tablet Take 1 tablet (100 mg total) by mouth at bedtime. 30 tablet 0   No current facility-administered medications for this visit.     Musculoskeletal: Strength & Muscle Tone: within normal limits Gait & Station: normal Patient leans: N/A  Psychiatric Specialty Exam: Review of Systems  Psychiatric/Behavioral:  Positive for decreased concentration and sleep disturbance. Negative for dysphoric mood, hallucinations, self-injury and suicidal ideas. The patient is nervous/anxious.  The patient is not hyperactive.    currently breastfeeding.There is no height or weight on file to calculate BMI.  General Appearance: Casual  Eye Contact:  Good  Speech:  Clear and Coherent and Normal Rate  Volume:  Normal  Mood:  Anxious and Depressed  Affect:  Congruent and Depressed  Thought Process:  Coherent, Goal Directed, and Descriptions of Associations: Intact  Orientation:  Full (Time, Place, and Person)  Thought Content: WDL   Suicidal Thoughts:  No  Homicidal Thoughts:  No  Memory:  Immediate;   Fair Recent;   Fair Remote;   Fair  Judgement:  Fair  Insight:  Present  Psychomotor Activity:  Normal  Concentration:  Concentration: Fair and Attention Span: Fair  Recall:  Good  Fund of Knowledge: Fair  Language: Good  Akathisia:  No  Handed:  Right  AIMS (if indicated): not done  Assets:  Communication Skills Desire for Improvement Housing Intimacy Leisure Time Physical Health Social Support Talents/Skills Transportation  ADL's:  Intact  Cognition: WNL  Sleep:  Fair   Screenings: GAD-7    Flowsheet Row Video [facility] Clinical Support from 09/28[facility]2021 [facility] from [facility]06[facility]20[facility] Counselor from [facility]/02[facility]2021 [facility]  Total GAD-7 Score 17 21 21 16 15       PHQ2-9    Flowsheet Row Video [facility]20[facility] Support from [facility]20[facility] from [facility]06[facility]2021 [facility] from [facility]2021 [facility] Counselor from [facility]02[facility]2021 [facility]  PHQ-2 Total Score 5 4 5  0 5  PHQ-9  Total Score 17 21 26  -- 20      Flowsheet Row Video [facility]20[facility] Clinical Support from [facility]28[facility]2021 [facility]2021 [facility]  C-SSRS RISK CATEGORY Low Risk Low Risk Moderate Risk        Assessment and Plan:   Vicki PennaCristal Ford is a 31 year old female with a past psychiatric history significant for bipolar 1  disorder, generalized anxiety disorder, and mood disorder who presents to [facility] via virtual video for follow-up medication management.  Ports that she has been doing better since the last encounter.  She reports that she has been able to receive rest while taking Seroquel; however, she reports that the medication makes her sleep for long periods of time and she continues to be groggy and waking.  Patient states that her manic and depressive symptoms are managed well and reports that she has not been experiencing as much anxiety as she has in the past.  Patient was recommended decreasing her dosage of Seroquel from 100 mg to 50 mg at bedtime for the management of her mood and bipolar disorder symptoms.  Patient also endorses experiencing issues with attention/concentration.  Patient was recommended Strattera 40 mg daily for the management of her attention/concentration issues.  Patient's medications to be e-prescribed to pharmacy of choice.  1. Bipolar 1 disorder (HCC)  - QUEtiapine (SEROQUEL) 100 MG tablet; Take 1 tablet (100 mg total) by mouth at bedtime.  Dispense: 30 tablet; Refill: 1 - lamoTRIgine (LAMICTAL) 100 MG tablet; Take 1 tablet (100 mg total) by mouth daily.  Dispense: 30 tablet; Refill: 1  2. GAD (generalized anxiety disorder)  - gabapentin (NEURONTIN) 100 MG capsule; Take 1 capsule (100 mg total) by mouth 3 (three) times daily.  Dispense: 90 capsule; Refill: 1 - QUEtiapine (SEROQUEL) 100 MG  tablet; Take 1 tablet (100 mg total) by mouth at bedtime.  Dispense: 30 tablet; Refill: 1  3. Mood disorder in conditions classified elsewhere  - lamoTRIgine (LAMICTAL) 100 MG tablet; Take 1 tablet (100 mg total) by mouth daily.  Dispense: 30 tablet; Refill: 1  Patient to follow in 4 weeks Provider spent a total of 23 minutes with the patient/reviewing the patient's chart  Meta Hatchet, PA 09/28/2021, 8:22 PM

## 2021-10-25 NOTE — Progress Notes (Signed)
Patient to be prescribed Strattera 40 mg daily due to issues with attention and concentration.

## 2021-10-26 ENCOUNTER — Other Ambulatory Visit: Payer: Self-pay

## 2021-10-31 ENCOUNTER — Other Ambulatory Visit: Payer: Self-pay

## 2021-11-01 ENCOUNTER — Ambulatory Visit (HOSPITAL_COMMUNITY): Payer: No Payment, Other | Admitting: Clinical

## 2021-11-25 ENCOUNTER — Encounter (HOSPITAL_COMMUNITY): Payer: Self-pay

## 2021-12-02 ENCOUNTER — Telehealth (HOSPITAL_COMMUNITY): Payer: No Payment, Other | Admitting: Physician Assistant

## 2021-12-06 ENCOUNTER — Other Ambulatory Visit: Payer: Self-pay

## 2021-12-06 ENCOUNTER — Other Ambulatory Visit (HOSPITAL_COMMUNITY): Payer: Self-pay

## 2021-12-12 ENCOUNTER — Other Ambulatory Visit: Payer: Self-pay

## 2021-12-17 ENCOUNTER — Other Ambulatory Visit (HOSPITAL_COMMUNITY): Payer: Self-pay | Admitting: Psychiatry

## 2021-12-17 ENCOUNTER — Other Ambulatory Visit (HOSPITAL_COMMUNITY): Payer: Self-pay

## 2021-12-19 ENCOUNTER — Other Ambulatory Visit (HOSPITAL_COMMUNITY): Payer: Self-pay

## 2021-12-21 ENCOUNTER — Other Ambulatory Visit (HOSPITAL_COMMUNITY): Payer: Self-pay

## 2022-01-13 ENCOUNTER — Other Ambulatory Visit (HOSPITAL_COMMUNITY): Payer: Self-pay

## 2022-01-13 ENCOUNTER — Other Ambulatory Visit (HOSPITAL_COMMUNITY): Payer: Self-pay | Admitting: Psychiatry

## 2022-01-13 ENCOUNTER — Other Ambulatory Visit (HOSPITAL_COMMUNITY): Payer: Self-pay | Admitting: Physician Assistant

## 2022-01-13 DIAGNOSIS — F0630 Mood disorder due to known physiological condition, unspecified: Secondary | ICD-10-CM

## 2022-01-13 DIAGNOSIS — R41840 Attention and concentration deficit: Secondary | ICD-10-CM

## 2022-01-13 DIAGNOSIS — F411 Generalized anxiety disorder: Secondary | ICD-10-CM

## 2022-01-13 DIAGNOSIS — F319 Bipolar disorder, unspecified: Secondary | ICD-10-CM

## 2022-01-13 MED ORDER — LAMOTRIGINE 100 MG PO TABS
100.0000 mg | ORAL_TABLET | Freq: Every day | ORAL | 1 refills | Status: DC
  Filled 2022-01-13: qty 30, 30d supply, fill #0

## 2022-01-13 MED ORDER — QUETIAPINE FUMARATE 50 MG PO TABS
50.0000 mg | ORAL_TABLET | Freq: Every day | ORAL | 1 refills | Status: DC
  Filled 2022-01-13: qty 30, 30d supply, fill #0

## 2022-01-13 MED ORDER — GABAPENTIN 100 MG PO CAPS
100.0000 mg | ORAL_CAPSULE | Freq: Three times a day (TID) | ORAL | 1 refills | Status: DC
  Filled 2022-01-13: qty 90, 30d supply, fill #0

## 2022-01-13 MED ORDER — ATOMOXETINE HCL 40 MG PO CAPS
40.0000 mg | ORAL_CAPSULE | Freq: Every day | ORAL | 1 refills | Status: DC
  Filled 2022-01-13: qty 30, 30d supply, fill #0

## 2022-01-13 NOTE — Telephone Encounter (Signed)
Patient needs a follow up appointment scheduled with this provider.

## 2022-01-14 ENCOUNTER — Other Ambulatory Visit (HOSPITAL_COMMUNITY): Payer: Self-pay

## 2022-01-16 ENCOUNTER — Other Ambulatory Visit (HOSPITAL_COMMUNITY): Payer: Self-pay

## 2022-01-24 ENCOUNTER — Other Ambulatory Visit (HOSPITAL_COMMUNITY): Payer: Self-pay

## 2022-01-30 IMAGING — US US MFM OB FOLLOW-UP
1 series · 13 of 28 positions shown · non-contrast
Comparison: none

[Series 1: us mfm ob follow-up · 13 of 86 slices shown]
[im 4/86]
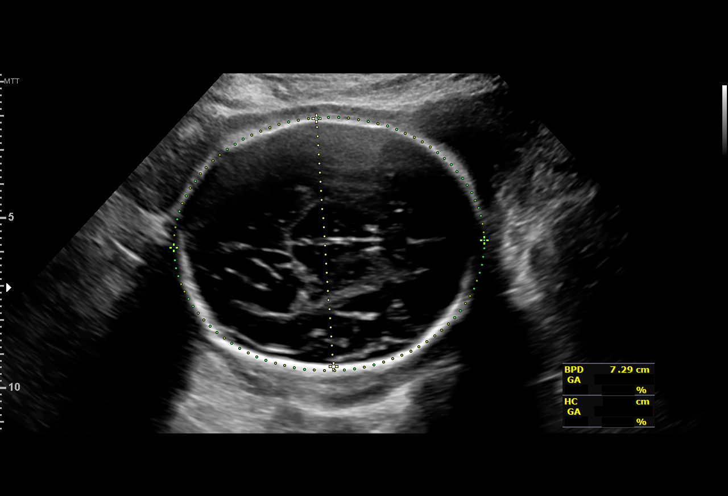
[im 10/86]
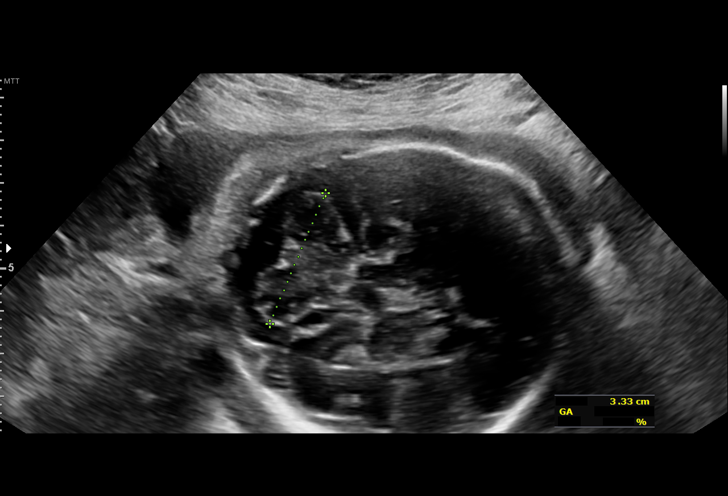
[im 16/86]
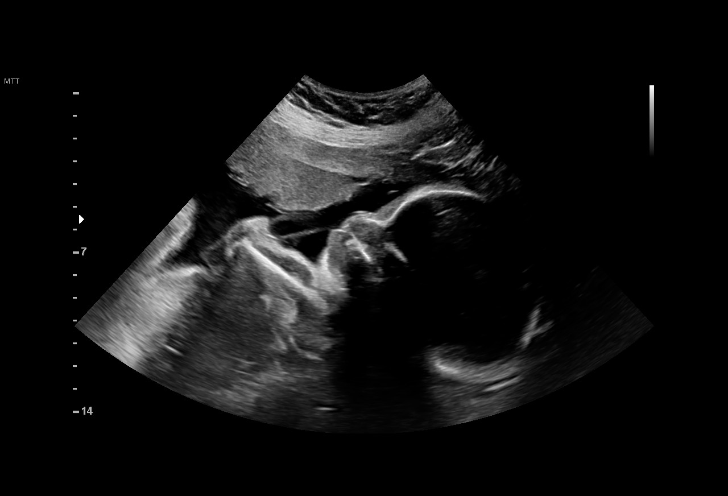
[im 23/86]
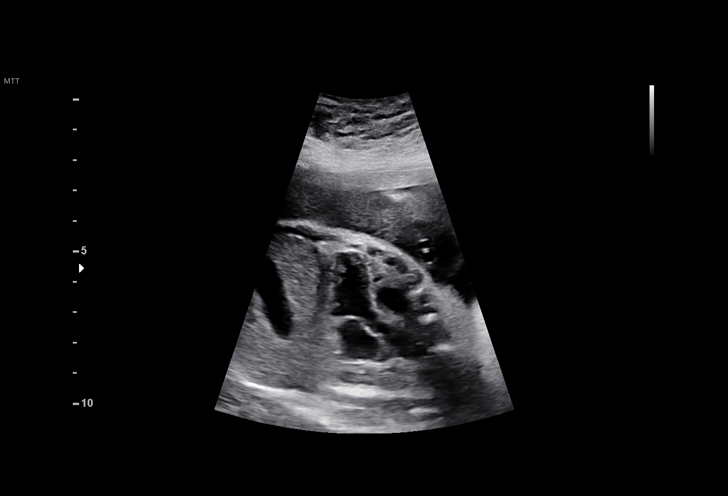
[im 29/86]
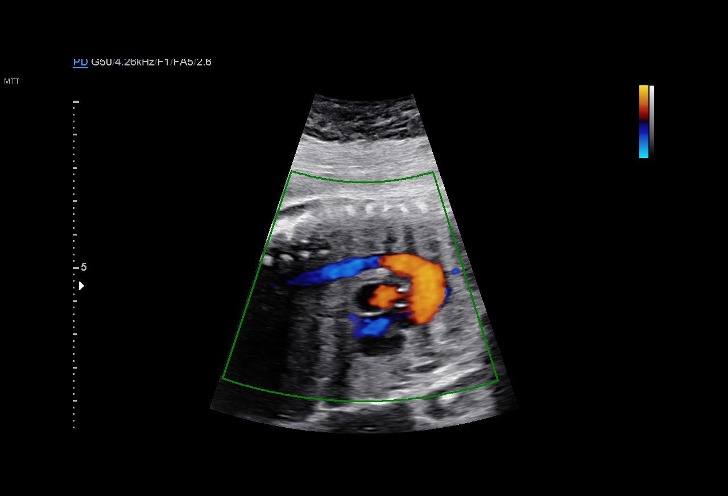
[im 35/86]
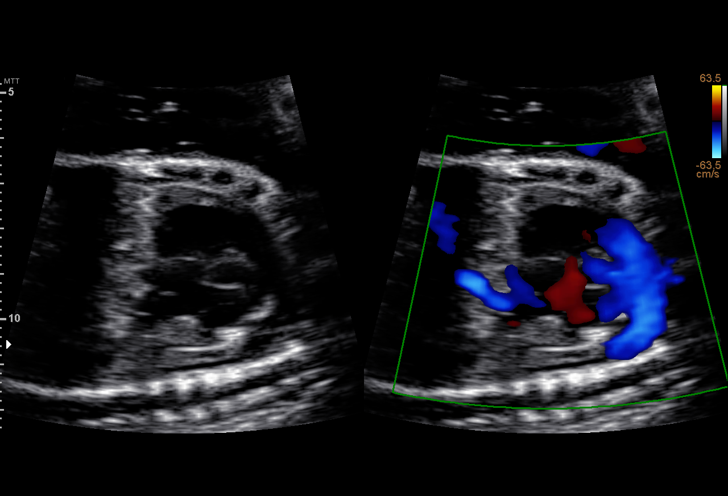
[im 45/86]
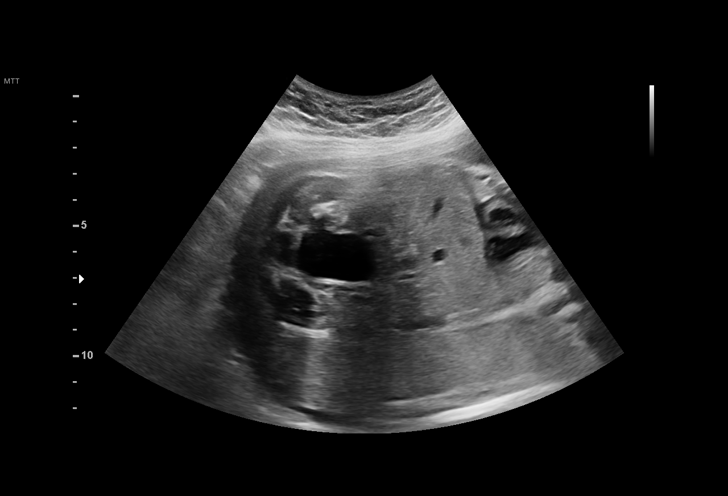
[im 51/86]
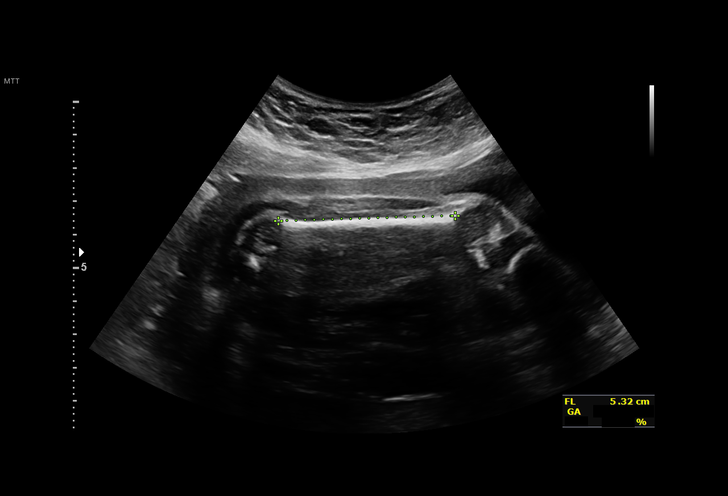
[im 57/86]
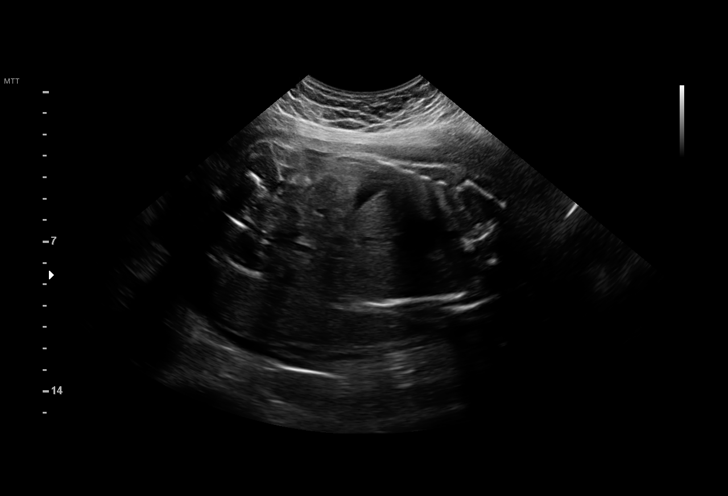
[im 63/86]
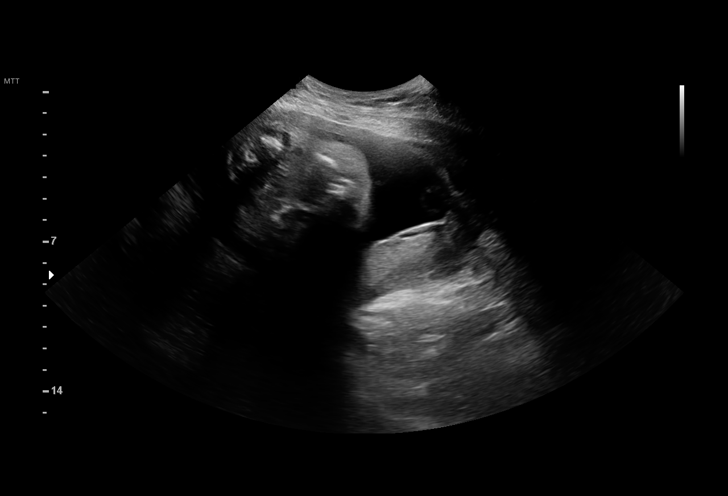
[im 70/86]
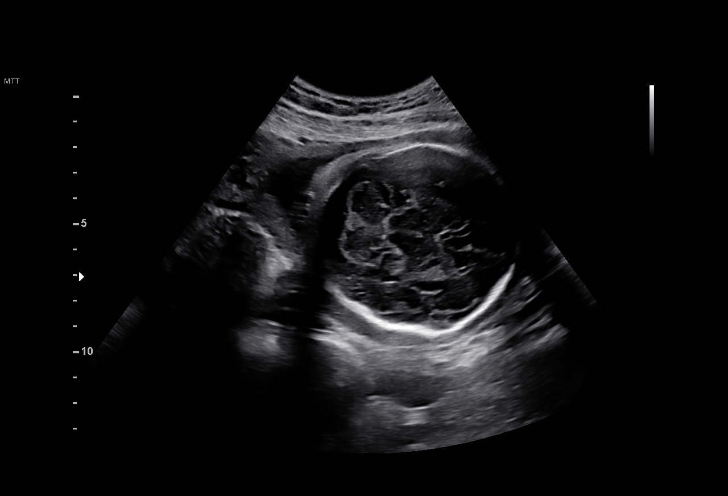
[im 76/86]
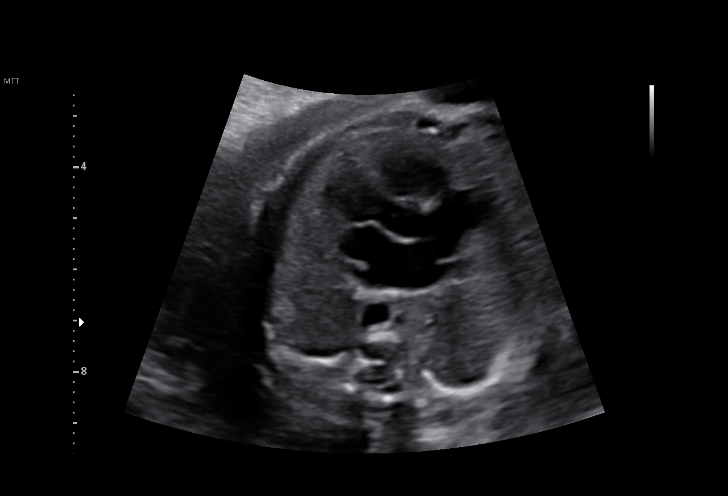
[im 82/86]
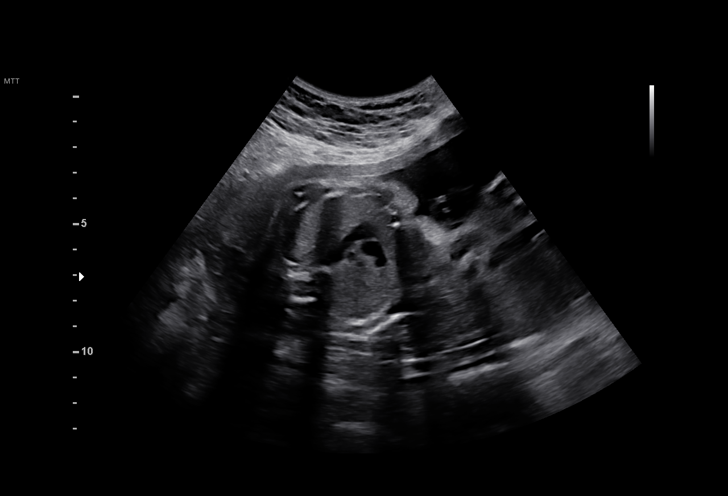

[13 of 28 positions shown; findings below may reference images not displayed]

KEBE

Indications

 Obesity complicating pregnancy, second
 trimester
 27 weeks gestation of pregnancy
 Encounter for antenatal screening for
 malformations
 Poor obstetric history: Previous preterm
 delivery, antepartum (Twins @ 02wks)
 History of cesarean delivery, currently
 pregnant
 Negative AFP
Vital Signs

                                                Height:        5'1"
Fetal Evaluation

 Num Of Fetuses:         1
 Fetal Heart Rate(bpm):  137
 Cardiac Activity:       Observed
 Presentation:           Cephalic
 Placenta:               Posterior
 P. Cord Insertion:      Previously Visualized

 Amniotic Fluid
 AFI FV:      Within normal limits

                             Largest Pocket(cm)

Biometry

 BPD:      72.8  mm     G. Age:  29w 1d         89  %    CI:         76.5   %    70 - 86
                                                         FL/HC:      19.8   %    18.6 -
 HC:      263.7  mm     G. Age:  28w 5d         63  %    HC/AC:      1.10        1.05 -
 AC:      239.9  mm     G. Age:  28w 2d         68  %    FL/BPD:     71.7   %    71 - 87
 FL:       52.2  mm     G. Age:  27w 6d         47  %    FL/AC:      21.8   %    20 - 24
 CER:      33.3  mm     G. Age:  28w 2d         84  %

 LV:        8.4  mm

 Est. FW:    0062  gm    2 lb 10 oz      70  %
OB History

 Gravidity:    3         Term:   0        Prem:   1        SAB:   1
 TOP:          0       Ectopic:  0        Living: 2
Gestational Age

 LMP:           29w 0d        Date:  06/09/19                 EDD:   03/15/20
 U/S Today:     28w 4d                                        EDD:   03/18/20
 Best:          27w 3d     Det. By:  Early Ultrasound         EDD:   03/26/20
                                     (08/19/19)
Anatomy

 Cranium:               Appears normal         LVOT:                   Appears normal
 Cavum:                 Appears normal         Aortic Arch:            Appears normal
 Ventricles:            Appears normal         Ductal Arch:            Appears normal
 Choroid Plexus:        Appears normal         Diaphragm:              Appears normal
 Cerebellum:            Appears normal         Stomach:                Appears normal, left
                                                                       sided
 Posterior Fossa:       Previously seen        Abdomen:                Appears normal
 Nuchal Fold:           Not applicable (>20    Abdominal Wall:         Previously seen
                        wks GA)
 Face:                  Orbits and profile     Cord Vessels:           Previously seen
                        previously seen
 Lips:                  Appears normal         Kidneys:                Appear normal
 Palate:                Not well visualized    Bladder:                Appears normal
 Thoracic:              Appears normal         Spine:                  Previously seen
 Heart:                 Appears normal         Upper Extremities:      Previously seen
                        (4CH, axis, and
                        situs)
 RVOT:                  Appears normal         Lower Extremities:      Previously seen

 Other:  Male gender previously seen. Heels and 5th digit previously seen.
         Technically difficult due to maternal habitus.
Cervix Uterus Adnexa

 Cervix
 Normal appearance by transabdominal scan.

 Uterus
 No abnormality visualized.
 Right Ovary
 Not visualized.

 Left Ovary
 Not visualized.

 Cul De Sac
 No free fluid seen.

 Adnexa
 No adnexal mass visualized.
Comments

 This patient was seen for a follow up exam as the fetal
 cardiac views were unable to be fully visualized during her
 prior exam.  She denies any problems since her last exam.
 She was informed that the fetal growth and amniotic fluid
 level appears appropriate for her gestational age.
 The views of the fetal heart were visualized today.  There
 were no obvious anomalies suspected.  The limitations of
 ultrasound in the detection of all anomalies was discussed.
 Follow-up as indicated.

## 2022-02-03 ENCOUNTER — Telehealth (INDEPENDENT_AMBULATORY_CARE_PROVIDER_SITE_OTHER): Payer: No Payment, Other | Admitting: Physician Assistant

## 2022-02-03 ENCOUNTER — Encounter (HOSPITAL_COMMUNITY): Payer: Self-pay | Admitting: Physician Assistant

## 2022-02-03 ENCOUNTER — Other Ambulatory Visit: Payer: Self-pay

## 2022-02-03 DIAGNOSIS — R41840 Attention and concentration deficit: Secondary | ICD-10-CM

## 2022-02-03 DIAGNOSIS — F319 Bipolar disorder, unspecified: Secondary | ICD-10-CM

## 2022-02-03 DIAGNOSIS — F0630 Mood disorder due to known physiological condition, unspecified: Secondary | ICD-10-CM

## 2022-02-03 DIAGNOSIS — F411 Generalized anxiety disorder: Secondary | ICD-10-CM

## 2022-02-03 MED ORDER — GABAPENTIN 300 MG PO CAPS
300.0000 mg | ORAL_CAPSULE | Freq: Three times a day (TID) | ORAL | 1 refills | Status: DC
  Filled 2022-02-03 – 2022-02-27 (×4): qty 90, 30d supply, fill #0

## 2022-02-03 MED ORDER — LAMOTRIGINE 100 MG PO TABS
100.0000 mg | ORAL_TABLET | Freq: Every day | ORAL | 1 refills | Status: DC
  Filled 2022-02-03 – 2022-02-27 (×3): qty 30, 30d supply, fill #0

## 2022-02-03 MED ORDER — ATOMOXETINE HCL 40 MG PO CAPS
40.0000 mg | ORAL_CAPSULE | Freq: Every day | ORAL | 1 refills | Status: DC
  Filled 2022-02-03 – 2022-02-27 (×3): qty 30, 30d supply, fill #0

## 2022-02-03 MED ORDER — ARIPIPRAZOLE 5 MG PO TABS
5.0000 mg | ORAL_TABLET | Freq: Every day | ORAL | 1 refills | Status: DC
  Filled 2022-02-03 – 2022-02-27 (×3): qty 30, 30d supply, fill #0

## 2022-02-03 NOTE — Progress Notes (Signed)
BH MD/PA/NP OP Progress Note  Virtual Visit via Telephone Note  I connected with Vicki Ford on 02/03/22 at  1:30 PM EDT by telephone and verified that I am speaking with the correct person using two identifiers.  Location: Patient: Home Provider: Clinic   I discussed the limitations, risks, security and privacy concerns of performing an evaluation and management service by telephone and the availability of in person appointments. I also discussed with the patient that there may be a patient responsible charge related to this service. The patient expressed understanding and agreed to proceed.  Follow Up Instructions:  I discussed the assessment and treatment plan with the patient. The patient was provided an opportunity to ask questions and all were answered. The patient agreed with the plan and demonstrated an understanding of the instructions.   The patient was advised to call back or seek an in-person evaluation if the symptoms worsen or if the condition fails to improve as anticipated.  I provided 24 minutes of non-face-to-face time during this encounter.  Meta Hatchet, PA   02/03/2022 1:56 PM Vicki Ford  MRN:  [id number]  Chief Complaint:  Chief Complaint   Follow-up; Medication Management    HPI:   Vicki Ford is a 31 year old female with a past psychiatric history significant for bipolar 1 disorder, generalized anxiety disorder, and mood disorder who presents to [facility] via virtual video for follow-up medication management.  Patient is currently being managed on the following medications:  Strattera 40 mg daily Lamictal 100 mg daily Seroquel 100 mg at bedtime Gabapentin 100 mg 3 times daily  Patient reports that she has not been able to function well through taking Seroquel.  She reports that she has tried taking the medication early before bedtime, but she still ends up waking up later.  Patient states that she also  feels sleepy upon waking and is unable to function.  Patient also states that her gabapentin has not been helpful in managing her anxiety describing the medication as a "Band-Aid" in the management of her anxiety.  Patient also reports that she experienced a depressive episode not too long ago and had to fight through her symptoms due to her medications being an effective.  Patient reports that her Lamictal is partially helpful.  She describes herself as having more bad days than good.  Patient rates her anxiety a 12 or 13 out of 10.  Patient states that she also feels paranoid and thinks of every bad possible outcomes that could occur.  Patient endorses bad insomnia and states that she has stayed awake for 3 to 4 days before sleeping for 20 hours straight.  A PHQ-9 screen was performed with the patient scoring a 23.  A GAD-7 screen was also performed the patient scored a 17.  Patient is alert and oriented x4, pleasant, calm, cooperative, and fully engaged in conversation during the encounter.  Patient states that she feels overwhelmed and tired but must turn off these feelings in order to get through her day.  Patient denies suicidal or homicidal ideations.  She further denies auditory or visual hallucinations and does not appear to be responding to internal/external stimuli.  Patient endorses poor sleep stating that she often stays awake for long periods of time before being able to sleep.  Patient endorses fair appetite stating that she often has a big lunch and a small snack during the day.  Patient denies alcohol consumption, tobacco use, and illicit drug use.  Visit Diagnosis:    ICD-10-CM   1. Bipolar 1 disorder (HCC)  F31.9 lamoTRIgine (LAMICTAL) 100 MG tablet    ARIPiprazole (ABILIFY) 5 MG tablet    2. Mood disorder in conditions classified elsewhere  F06.30 lamoTRIgine (LAMICTAL) 100 MG tablet    3. GAD (generalized anxiety disorder)  F41.1 gabapentin (NEURONTIN) 300 MG capsule    4.  Attention or concentration deficit  R41.840 atomoxetine (STRATTERA) 40 MG capsule       Past Psychiatric History:  Bipolar 1 disorder Generalized anxiety disorder  Patient has a history of depression in the past with a suicide attempt.  Her current symptomatology since she has been at this clinic shows patient to have bipolar disorder as she has a lot of ups and downs in her mood.  Past Medical History:  Past Medical History:  Diagnosis Date   Anxiety    takes zoloft, buspar, & atarax   Asthma    sports induced asthma   Depression    takes zoloft & buspar   Headache    Preterm labor     Past Surgical History:  Procedure Laterality Date   CESAREAN SECTION     GALLBLADDER SURGERY  01/15/2019   HERNIA REPAIR     TUBAL LIGATION N/A 03/26/2020   Procedure: POST PARTUM TUBAL LIGATION;  Surgeon: Levie Heritage, DO;  Location: [facility] LD [facility]S;  Service: Gynecology;  Laterality: N/A;    Family Psychiatric History:  None reported  Family History:  Family History  Problem Relation Age of Onset   Cancer Mother    Depression Mother    ADD / ADHD Sister    Depression Sister     Social History:  Social History   Socioeconomic History   Marital status: Married    Spouse name: Not on file   Number of children: Not on file   Years of education: Not on file   Highest education level: Not on file  Occupational History   Not on file  Tobacco Use   Smoking status: Former    Types: Cigarettes    Quit date: 03/30/2019    Years since quitting: 2.8   Smokeless tobacco: Never   Tobacco comments:    not since pregnancy  Vaping Use   Vaping Use: Former  Substance and Sexual Activity   Alcohol use: Not Currently   Drug use: Not Currently    Comment: as a teen   Sexual activity: Yes  Other Topics Concern   Not on file  Social History Narrative   Not on file   Social Determinants of Health   Financial Resource Strain: Not on file  Food Insecurity: Not on file  Transportation  Needs: Not on file  Physical Activity: Not on file  Stress: Not on file  Social Connections: Not on file    Allergies:  Allergies  Allergen Reactions   Liver Swelling    Metabolic Disorder Labs: No results found for: "HGBA1C", "MPG" No results found for: "PROLACTIN" No results found for: "CHOL", "TRIG", "HDL", "CHOLHDL", "VLDL", "LDLCALC" No results found for: "TSH"  Therapeutic Level Labs: No results found for: "LITHIUM" No results found for: "VALPROATE" No results found for: "CBMZ"  Current Medications: Current Outpatient Medications  Medication Sig Dispense Refill   ARIPiprazole (ABILIFY) 5 MG tablet Take 1 tablet (5 mg total) by mouth daily. 30 tablet 1   Ascorbic Acid (VITAMIN C WITH ROSE HIPS) 1000 MG tablet Take 1,000 mg by mouth daily.     atomoxetine (  STRATTERA) 40 MG capsule Take 1 capsule (40 mg total) by mouth daily. 30 capsule 1   gabapentin (NEURONTIN) 300 MG capsule Take 1 capsule (300 mg total) by mouth 3 (three) times daily. 90 capsule 1   hydrOXYzine (ATARAX/VISTARIL) 25 MG tablet Take 1 tablet (25 mg total) by mouth every 6 (six) hours as needed for anxiety. 45 tablet 2   hydrOXYzine (VISTARIL) 25 MG capsule Take 1 capsule (25 mg total) by mouth 3 (three) times daily as needed. 45 capsule 1   ibuprofen (ADVIL) 600 MG tablet TAKE 1 TABLET(600 MG) BY MOUTH EVERY 6 HOURS AS NEEDED 30 tablet 0   lamoTRIgine (LAMICTAL) 100 MG tablet Take 1 tablet (100 mg total) by mouth daily. 30 tablet 1   oxyCODONE (OXY IR/ROXICODONE) 5 MG immediate release tablet Take 1 tablet (5 mg total) by mouth every 4 (four) hours as needed for severe pain or breakthrough pain. 10 tablet 0   Prenatal Vit-Fe Fumarate-FA (PRENATAL MULTIVITAMIN) TABS tablet Take 1 tablet by mouth daily at 12 noon.     QUEtiapine (SEROQUEL) 50 MG tablet Take 1 tablet (50 mg total) by mouth at bedtime. 30 tablet 1   traZODone (DESYREL) 100 MG tablet Take 1 tablet (100 mg total) by mouth at bedtime. 30 tablet 0    No current facility-administered medications for this visit.     Musculoskeletal: Strength & Muscle Tone: within normal limits Gait & Station: normal Patient leans: N/A  Psychiatric Specialty Exam: Review of Systems  Psychiatric/Behavioral:  Positive for decreased concentration and sleep disturbance. Negative for dysphoric mood, hallucinations, self-injury and suicidal ideas. The patient is nervous/anxious. The patient is not hyperactive.     currently breastfeeding.There is no height or weight on file to calculate BMI.  General Appearance: Casual  Eye Contact:  Good  Speech:  Clear and Coherent and Normal Rate  Volume:  Normal  Mood:  Anxious and Depressed  Affect:  Congruent and Depressed  Thought Process:  Coherent, Goal Directed, and Descriptions of Associations: Intact  Orientation:  Full (Time, Place, and Person)  Thought Content: WDL   Suicidal Thoughts:  No  Homicidal Thoughts:  No  Memory:  Immediate;   Fair Recent;   Fair Remote;   Fair  Judgement:  Fair  Insight:  Present  Psychomotor Activity:  Normal  Concentration:  Concentration: Fair and Attention Span: Fair  Recall:  Good  Fund of Knowledge: Fair  Language: Good  Akathisia:  No  Handed:  Right  AIMS (if indicated): not done  Assets:  Communication Skills Desire for Improvement Housing Intimacy Leisure Time Physical Health Social Support Talents/Skills Transportation  ADL's:  Intact  Cognition: WNL  Sleep:  Fair   Screenings: GAD-7    Flowsheet Row Video Visit from 10/25/2021 in [facility] Clinical Support from 09/28/2021 in [facility] Clinical Support from 07/06/2021 in [facility] from 03/02/2021 in [facility] from [facility]14/[facility]  Total GAD-7 Score 17 21 21 16 15       PHQ2-9     Flowsheet Row Video Visit from 02/03/2022 in [facility] Video Visit from 10/25/2021 in [facility] Clinical Support from 09/28/2021 in [facility] Clinical Support from 07/06/2021 in [facility] Clinical Support from 06/21/2021 in [facility]  PHQ-2 Total  Score 6 5 4 5  0  PHQ-9 Total Score 23 17 21 26  --      Flowsheet Row Video Visit from 02/03/2022 in [facility] Video Visit from 10/25/2021 in [facility] Clinical Support from 09/28/2021 in [facility]  C-SSRS RISK CATEGORY Low Risk Low Risk Low Risk        Assessment and Plan:   Vicki Ford is a 31 year old female with a past psychiatric history significant for bipolar 1 disorder, generalized anxiety disorder, and mood disorder who presents to [facility] via virtual video for follow-up medication management.  Patient reports that she has been unable to focus while taking her Seroquel.  Patient also states that gabapentin has been minimally effective and managing her anxiety.  Patient continues to endorse depressive episodes as well as elevated anxiety.  Since her medication is minimally effective, patient was instructed to discontinue Seroquel and to be placed on Abilify 5 mg daily for the management of her mood stability and depressive symptoms.  Before discontinuing, patient was informed to take Seroquel 25 mg at bedtime for 2 weeks before discontinuing.  Patient vocalized understanding.  Patient was also recommended increasing her gabapentin dosage from 100 mg to 300 mg 3 times daily for the management of her anxiety.  Patient to continue taking all her medications as prescribed.  Collaboration of Care: Collaboration of Care: Medication Management AEB provider managing patient's  psychiatric medications and Psychiatrist AEB patient being seen by a mental health provider  Patient/Guardian was advised Release of Information must be obtained prior to any record release in order to collaborate their care with an outside provider. Patient/Guardian was advised if they have not already done so to contact the registration department to sign all necessary forms in order for [place] to release information regarding their care.   Consent: Patient/Guardian gives verbal consent for treatment and assignment of benefits for services provided during this visit. Patient/Guardian expressed understanding and agreed to proceed.  1. Bipolar 1 disorder (HCC)  - lamoTRIgine (LAMICTAL) 100 MG tablet; Take 1 tablet (100 mg total) by mouth daily.  Dispense: 30 tablet; Refill: 1 - ARIPiprazole (ABILIFY) 5 MG tablet; Take 1 tablet (5 mg total) by mouth daily.  Dispense: 30 tablet; Refill: 1  2. Mood disorder in conditions classified elsewhere  - lamoTRIgine (LAMICTAL) 100 MG tablet; Take 1 tablet (100 mg total) by mouth daily.  Dispense: 30 tablet; Refill: 1  3. GAD (generalized anxiety disorder)  - gabapentin (NEURONTIN) 300 MG capsule; Take 1 capsule (300 mg total) by mouth 3 (three) times daily.  Dispense: 90 capsule; Refill: 1  4. Attention or concentration deficit  - atomoxetine (STRATTERA) 40 MG capsule; Take 1 capsule (40 mg total) by mouth daily.  Dispense: 30 capsule; Refill: 1  Patient to follow in 6 weeks Provider spent a total of 24 minutes with the patient/reviewing the patient's chart  Meta Hatchet, PA 02/03/2022, 1:56 PM

## 2022-02-08 ENCOUNTER — Other Ambulatory Visit (HOSPITAL_COMMUNITY): Payer: Self-pay

## 2022-02-09 ENCOUNTER — Other Ambulatory Visit: Payer: Self-pay

## 2022-02-21 ENCOUNTER — Other Ambulatory Visit (HOSPITAL_COMMUNITY): Payer: Self-pay

## 2022-02-27 ENCOUNTER — Other Ambulatory Visit (HOSPITAL_COMMUNITY): Payer: Self-pay

## 2022-02-27 ENCOUNTER — Other Ambulatory Visit (HOSPITAL_COMMUNITY): Payer: Self-pay | Admitting: Psychiatry

## 2022-02-27 ENCOUNTER — Other Ambulatory Visit: Payer: Self-pay

## 2022-02-28 ENCOUNTER — Other Ambulatory Visit: Payer: Self-pay

## 2022-03-08 ENCOUNTER — Encounter (HOSPITAL_COMMUNITY): Payer: Self-pay | Admitting: Psychiatry

## 2022-03-08 ENCOUNTER — Other Ambulatory Visit: Payer: Self-pay

## 2022-03-08 ENCOUNTER — Telehealth (INDEPENDENT_AMBULATORY_CARE_PROVIDER_SITE_OTHER): Payer: No Payment, Other | Admitting: Psychiatry

## 2022-03-08 DIAGNOSIS — F411 Generalized anxiety disorder: Secondary | ICD-10-CM

## 2022-03-08 DIAGNOSIS — F0630 Mood disorder due to known physiological condition, unspecified: Secondary | ICD-10-CM

## 2022-03-08 DIAGNOSIS — F319 Bipolar disorder, unspecified: Secondary | ICD-10-CM

## 2022-03-08 DIAGNOSIS — R41840 Attention and concentration deficit: Secondary | ICD-10-CM

## 2022-03-08 DIAGNOSIS — Z79899 Other long term (current) drug therapy: Secondary | ICD-10-CM

## 2022-03-08 MED ORDER — GABAPENTIN 100 MG PO CAPS
100.0000 mg | ORAL_CAPSULE | Freq: Every day | ORAL | 2 refills | Status: DC
Start: 2022-03-08 — End: 2022-05-03
  Filled 2022-03-08: qty 30, 30d supply, fill #0

## 2022-03-08 MED ORDER — HYDROXYZINE PAMOATE 25 MG PO CAPS
25.0000 mg | ORAL_CAPSULE | Freq: Two times a day (BID) | ORAL | 2 refills | Status: AC | PRN
  Filled 2022-03-08 – 2022-05-27 (×2): qty 60, 30d supply, fill #0

## 2022-03-08 MED ORDER — ATOMOXETINE HCL 40 MG PO CAPS
40.0000 mg | ORAL_CAPSULE | Freq: Every day | ORAL | 2 refills | Status: DC
  Filled 2022-03-08 – 2022-04-18 (×4): qty 30, 30d supply, fill #0
  Filled 2022-05-27 – 2022-05-31 (×2): qty 30, 30d supply, fill #1

## 2022-03-08 MED ORDER — GABAPENTIN 300 MG PO CAPS
300.0000 mg | ORAL_CAPSULE | Freq: Two times a day (BID) | ORAL | 2 refills | Status: DC
  Filled 2022-03-08 – 2022-04-18 (×4): qty 60, 30d supply, fill #0

## 2022-03-08 MED ORDER — ARIPIPRAZOLE 5 MG PO TABS
5.0000 mg | ORAL_TABLET | Freq: Every day | ORAL | 2 refills | Status: DC
  Filled 2022-03-08 – 2022-04-18 (×4): qty 30, 30d supply, fill #0
  Filled 2022-05-27 – 2022-05-31 (×2): qty 30, 30d supply, fill #1

## 2022-03-08 MED ORDER — LAMOTRIGINE 100 MG PO TABS
100.0000 mg | ORAL_TABLET | Freq: Every day | ORAL | 2 refills | Status: DC
  Filled 2022-03-08 – 2022-04-18 (×4): qty 30, 30d supply, fill #0

## 2022-03-08 NOTE — Patient Instructions (Signed)
Thank you for attending your appointment today.  -- CHANGE how you take gabapentin to 300 mg in the morning + 100 mg in the afternoon + 300 mg nightly -- Continue other medications as prescribed. -- At your earliest convenience, go to your nearest [facility] to obtain updated labs. -- Reach out to [facility] to inquire about establishing with a PCP.   [email or web address]  Please do not make any changes to medications without first discussing with your provider. If you are experiencing a psychiatric emergency, please call 911 or present to your nearest emergency department. Additional crisis, medication management, and therapy resources are included below.  [facility]  [address] [place], [place] [address] [phone] WALK-IN URGENT CARE 24/7 FOR ANYONE [address] [place], [place] Fax: [phone] [email or web address] *Interpreters available *Accepts all insurance and uninsured for Urgent Care needs *Accepts Medicaid and uninsured for outpatient treatment (below)      ONLY FOR [facility]  Below:    Outpatient New Patient Assessment/Therapy Walk-ins:        Monday -Thursday 8am until slots are full.        Every Friday 1pm-4pm  (first come, first served)                   New Patient Psychiatry/Medication Management        Monday-Friday 8am-11am (first come, first served)               For all walk-ins we ask that you arrive by 7:15am, because patients will be seen in the order of arrival.
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
## 2022-03-15 ENCOUNTER — Other Ambulatory Visit: Payer: Self-pay

## 2022-03-16 ENCOUNTER — Ambulatory Visit (HOSPITAL_COMMUNITY): Payer: No Payment, Other | Admitting: Licensed Clinical Social Worker

## 2022-03-17 ENCOUNTER — Telehealth (HOSPITAL_COMMUNITY): Payer: No Payment, Other | Admitting: Physician Assistant

## 2022-03-27 ENCOUNTER — Other Ambulatory Visit: Payer: Self-pay

## 2022-04-10 ENCOUNTER — Other Ambulatory Visit: Payer: Self-pay

## 2022-04-17 ENCOUNTER — Other Ambulatory Visit: Payer: Self-pay

## 2022-04-18 ENCOUNTER — Other Ambulatory Visit: Payer: Self-pay

## 2022-04-18 ENCOUNTER — Other Ambulatory Visit (HOSPITAL_COMMUNITY): Payer: Self-pay

## 2022-04-19 ENCOUNTER — Other Ambulatory Visit: Payer: Self-pay

## 2022-04-24 ENCOUNTER — Other Ambulatory Visit: Payer: Self-pay

## 2022-05-02 NOTE — Patient Instructions (Incomplete)
Thank you for attending your appointment today.  -- INCREASE lamotrigine to 150 mg daily -- Use hydroxyzine 25 mg up to twice daily as needed for anxiety and 50 mg nightly as needed for sleep -- Continue other medications as prescribed.  Please do not make any changes to medications without first discussing with your provider. If you are experiencing a psychiatric emergency, please call 911 or present to your nearest emergency department. Additional crisis, medication management, and therapy resources are included below.  [facility]  [address] [place], [place] [address] [phone] WALK-IN URGENT CARE 24/7 FOR ANYONE [address] [place], [place]  [phone] Fax: [phone] [email or web address] *Interpreters available *Accepts all insurance and uninsured for Urgent Care needs *Accepts Medicaid and uninsured for outpatient treatment (below)      ONLY FOR [facility]  Below:    Outpatient New Patient Assessment/Therapy Walk-ins:        Monday -Thursday 8am until slots are full.        Every Friday 1pm-4pm  (first come, first served)                   New Patient Psychiatry/Medication Management        Monday-Friday 8am-11am (first come, first served)               For all walk-ins we ask that you arrive by 7:15am, because patients will be seen in the order of arrival.
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
## 2022-05-03 ENCOUNTER — Telehealth (INDEPENDENT_AMBULATORY_CARE_PROVIDER_SITE_OTHER): Payer: No Payment, Other | Admitting: Psychiatry

## 2022-05-03 ENCOUNTER — Other Ambulatory Visit: Payer: Self-pay

## 2022-05-03 ENCOUNTER — Encounter (HOSPITAL_COMMUNITY): Payer: Self-pay | Admitting: Psychiatry

## 2022-05-03 DIAGNOSIS — R41840 Attention and concentration deficit: Secondary | ICD-10-CM | POA: Diagnosis not present

## 2022-05-03 DIAGNOSIS — F411 Generalized anxiety disorder: Secondary | ICD-10-CM

## 2022-05-03 DIAGNOSIS — Z79899 Other long term (current) drug therapy: Secondary | ICD-10-CM | POA: Diagnosis not present

## 2022-05-03 DIAGNOSIS — F319 Bipolar disorder, unspecified: Secondary | ICD-10-CM | POA: Diagnosis not present

## 2022-05-03 MED ORDER — GABAPENTIN 100 MG PO CAPS
100.0000 mg | ORAL_CAPSULE | Freq: Every day | ORAL | 2 refills | Status: DC | PRN
  Filled 2022-05-03: qty 30, 30d supply, fill #0

## 2022-05-03 MED ORDER — LAMOTRIGINE 150 MG PO TABS
150.0000 mg | ORAL_TABLET | Freq: Every day | ORAL | 2 refills | Status: DC
  Filled 2022-05-03 – 2022-06-07 (×3): qty 30, 30d supply, fill #0

## 2022-05-03 MED ORDER — GABAPENTIN 300 MG PO CAPS
300.0000 mg | ORAL_CAPSULE | Freq: Three times a day (TID) | ORAL | 2 refills | Status: DC
  Filled 2022-05-03 – 2022-06-07 (×3): qty 90, 30d supply, fill #0

## 2022-05-09 ENCOUNTER — Other Ambulatory Visit: Payer: Self-pay

## 2022-05-28 ENCOUNTER — Other Ambulatory Visit (HOSPITAL_COMMUNITY): Payer: Self-pay

## 2022-05-29 ENCOUNTER — Other Ambulatory Visit: Payer: Self-pay

## 2022-05-30 ENCOUNTER — Other Ambulatory Visit: Payer: Self-pay

## 2022-05-31 ENCOUNTER — Other Ambulatory Visit (HOSPITAL_COMMUNITY): Payer: Self-pay

## 2022-05-31 ENCOUNTER — Other Ambulatory Visit: Payer: Self-pay

## 2022-05-31 ENCOUNTER — Encounter (HOSPITAL_COMMUNITY): Payer: Self-pay

## 2022-06-02 ENCOUNTER — Encounter (HOSPITAL_COMMUNITY): Payer: Self-pay

## 2022-06-02 ENCOUNTER — Other Ambulatory Visit (HOSPITAL_COMMUNITY): Payer: Self-pay

## 2022-06-05 ENCOUNTER — Other Ambulatory Visit: Payer: Self-pay

## 2022-06-07 ENCOUNTER — Other Ambulatory Visit: Payer: Self-pay

## 2022-06-07 ENCOUNTER — Other Ambulatory Visit (HOSPITAL_COMMUNITY): Payer: Self-pay

## 2022-06-14 ENCOUNTER — Telehealth (HOSPITAL_COMMUNITY): Payer: Self-pay

## 2022-06-16 ENCOUNTER — Telehealth (INDEPENDENT_AMBULATORY_CARE_PROVIDER_SITE_OTHER): Payer: No Payment, Other | Admitting: Physician Assistant

## 2022-06-16 ENCOUNTER — Other Ambulatory Visit: Payer: Self-pay

## 2022-06-16 ENCOUNTER — Telehealth (HOSPITAL_COMMUNITY): Payer: No Payment, Other | Admitting: Psychiatry

## 2022-06-16 ENCOUNTER — Encounter (HOSPITAL_COMMUNITY): Payer: Self-pay | Admitting: Physician Assistant

## 2022-06-16 DIAGNOSIS — F411 Generalized anxiety disorder: Secondary | ICD-10-CM

## 2022-06-16 DIAGNOSIS — F319 Bipolar disorder, unspecified: Secondary | ICD-10-CM

## 2022-06-16 DIAGNOSIS — R41840 Attention and concentration deficit: Secondary | ICD-10-CM | POA: Diagnosis not present

## 2022-06-16 MED ORDER — ATOMOXETINE HCL 40 MG PO CAPS
40.0000 mg | ORAL_CAPSULE | Freq: Every day | ORAL | 1 refills | Status: DC
  Filled 2022-06-16 – 2022-07-22 (×2): qty 30, 30d supply, fill #0

## 2022-06-16 MED ORDER — GABAPENTIN 400 MG PO CAPS
400.0000 mg | ORAL_CAPSULE | Freq: Three times a day (TID) | ORAL | 1 refills | Status: DC
  Filled 2022-06-16: qty 90, 30d supply, fill #0

## 2022-06-16 MED ORDER — LAMOTRIGINE 150 MG PO TABS
150.0000 mg | ORAL_TABLET | Freq: Every day | ORAL | 1 refills | Status: DC
  Filled 2022-06-16 – 2022-07-22 (×2): qty 30, 30d supply, fill #0

## 2022-06-16 MED ORDER — GABAPENTIN 100 MG PO CAPS
100.0000 mg | ORAL_CAPSULE | Freq: Every day | ORAL | 1 refills | Status: DC | PRN
  Filled 2022-06-16: qty 30, 30d supply, fill #0

## 2022-06-16 MED ORDER — ARIPIPRAZOLE 10 MG PO TABS
10.0000 mg | ORAL_TABLET | Freq: Every day | ORAL | 1 refills | Status: DC
  Filled 2022-06-16: qty 30, 30d supply, fill #0
  Filled 2022-07-22: qty 30, 30d supply, fill #1
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
## 2022-06-19 ENCOUNTER — Other Ambulatory Visit: Payer: Self-pay

## 2022-06-26 ENCOUNTER — Ambulatory Visit: Payer: Medicaid Other | Attending: Family Medicine | Admitting: Family Medicine

## 2022-07-22 ENCOUNTER — Other Ambulatory Visit: Payer: Self-pay

## 2022-07-22 ENCOUNTER — Other Ambulatory Visit (HOSPITAL_COMMUNITY): Payer: Self-pay

## 2022-07-24 ENCOUNTER — Other Ambulatory Visit: Payer: Self-pay

## 2022-07-28 ENCOUNTER — Telehealth (INDEPENDENT_AMBULATORY_CARE_PROVIDER_SITE_OTHER): Payer: No Payment, Other | Admitting: Physician Assistant

## 2022-07-28 DIAGNOSIS — F319 Bipolar disorder, unspecified: Secondary | ICD-10-CM

## 2022-07-28 DIAGNOSIS — R41840 Attention and concentration deficit: Secondary | ICD-10-CM

## 2022-07-28 DIAGNOSIS — F411 Generalized anxiety disorder: Secondary | ICD-10-CM

## 2022-07-31 ENCOUNTER — Other Ambulatory Visit: Payer: Self-pay

## 2022-07-31 ENCOUNTER — Encounter (HOSPITAL_COMMUNITY): Payer: Self-pay | Admitting: Physician Assistant

## 2022-07-31 MED ORDER — GABAPENTIN 400 MG PO CAPS
400.0000 mg | ORAL_CAPSULE | Freq: Three times a day (TID) | ORAL | 1 refills | Status: DC
  Filled 2022-07-31: qty 90, 30d supply, fill #0
  Filled 2022-09-18 (×2): qty 90, 30d supply, fill #1

## 2022-07-31 MED ORDER — GABAPENTIN 100 MG PO CAPS
100.0000 mg | ORAL_CAPSULE | Freq: Every day | ORAL | 1 refills | Status: DC | PRN
  Filled 2022-07-31: qty 30, 30d supply, fill #0

## 2022-07-31 MED ORDER — ARIPIPRAZOLE 10 MG PO TABS
10.0000 mg | ORAL_TABLET | Freq: Every day | ORAL | 1 refills | Status: DC
  Filled 2022-07-31: qty 30, 30d supply, fill #0
  Filled 2022-09-18 (×2): qty 30, 30d supply, fill #1

## 2022-07-31 MED ORDER — ATOMOXETINE HCL 40 MG PO CAPS
40.0000 mg | ORAL_CAPSULE | Freq: Every day | ORAL | 1 refills | Status: DC
  Filled 2022-07-31: qty 30, 30d supply, fill #0
  Filled 2022-08-02 – 2022-09-18 (×5): qty 30, 30d supply, fill #1

## 2022-07-31 MED ORDER — LAMOTRIGINE 200 MG PO TABS
200.0000 mg | ORAL_TABLET | Freq: Every day | ORAL | 1 refills | Status: DC
  Filled 2022-07-31: qty 30, 30d supply, fill #0
  Filled 2022-09-18 (×2): qty 30, 30d supply, fill #1
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
## 2022-08-02 ENCOUNTER — Other Ambulatory Visit: Payer: Self-pay

## 2022-08-07 ENCOUNTER — Other Ambulatory Visit: Payer: Self-pay

## 2022-09-08 ENCOUNTER — Other Ambulatory Visit: Payer: Self-pay

## 2022-09-14 ENCOUNTER — Other Ambulatory Visit: Payer: Self-pay

## 2022-09-18 ENCOUNTER — Other Ambulatory Visit: Payer: Self-pay

## 2022-09-29 ENCOUNTER — Telehealth (INDEPENDENT_AMBULATORY_CARE_PROVIDER_SITE_OTHER): Payer: No Payment, Other | Admitting: Physician Assistant

## 2022-09-29 ENCOUNTER — Other Ambulatory Visit: Payer: Self-pay

## 2022-09-29 DIAGNOSIS — R41840 Attention and concentration deficit: Secondary | ICD-10-CM | POA: Diagnosis not present

## 2022-09-29 DIAGNOSIS — F319 Bipolar disorder, unspecified: Secondary | ICD-10-CM

## 2022-09-29 DIAGNOSIS — F411 Generalized anxiety disorder: Secondary | ICD-10-CM

## 2022-09-29 MED ORDER — ATOMOXETINE HCL 40 MG PO CAPS
40.0000 mg | ORAL_CAPSULE | Freq: Every day | ORAL | 2 refills | Status: DC
  Filled 2022-09-29 – 2022-10-16 (×2): qty 30, 30d supply, fill #0
  Filled 2023-01-19: qty 30, 30d supply, fill #1
  Filled 2023-02-28: qty 30, 30d supply, fill #2

## 2022-09-29 MED ORDER — GABAPENTIN 400 MG PO CAPS
400.0000 mg | ORAL_CAPSULE | Freq: Three times a day (TID) | ORAL | 2 refills | Status: DC
  Filled 2022-09-29 – 2022-10-16 (×2): qty 90, 30d supply, fill #0
  Filled 2023-02-28: qty 90, 30d supply, fill #1
  Filled 2023-04-19 – 2023-06-20 (×3): qty 90, 30d supply, fill #2

## 2022-09-29 MED ORDER — GABAPENTIN 100 MG PO CAPS
100.0000 mg | ORAL_CAPSULE | Freq: Every day | ORAL | 2 refills | Status: DC | PRN
  Filled 2022-09-29: qty 30, 30d supply, fill #0

## 2022-09-29 MED ORDER — ARIPIPRAZOLE 5 MG PO TABS
5.0000 mg | ORAL_TABLET | Freq: Every day | ORAL | 2 refills | Status: DC
  Filled 2022-09-29 – 2022-10-16 (×2): qty 30, 30d supply, fill #0
  Filled 2023-01-19 (×2): qty 30, 30d supply, fill #1
  Filled 2023-02-28: qty 30, 30d supply, fill #2

## 2022-09-29 MED ORDER — LAMOTRIGINE 200 MG PO TABS
200.0000 mg | ORAL_TABLET | Freq: Every day | ORAL | 2 refills | Status: DC
  Filled 2022-09-29 – 2022-10-16 (×2): qty 30, 30d supply, fill #0
  Filled 2023-02-28: qty 30, 30d supply, fill #1
  Filled 2023-04-19 – 2023-06-20 (×3): qty 30, 30d supply, fill #2

## 2022-10-02 ENCOUNTER — Encounter (HOSPITAL_COMMUNITY): Payer: Self-pay | Admitting: Physician Assistant
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
## 2022-10-05 ENCOUNTER — Other Ambulatory Visit: Payer: Self-pay

## 2022-10-16 ENCOUNTER — Other Ambulatory Visit: Payer: Self-pay

## 2022-10-19 ENCOUNTER — Other Ambulatory Visit: Payer: Self-pay

## 2023-01-19 ENCOUNTER — Other Ambulatory Visit: Payer: Self-pay

## 2023-03-01 ENCOUNTER — Other Ambulatory Visit: Payer: Self-pay

## 2023-03-05 ENCOUNTER — Other Ambulatory Visit: Payer: Self-pay

## 2023-04-19 ENCOUNTER — Other Ambulatory Visit: Payer: Self-pay

## 2023-04-19 ENCOUNTER — Other Ambulatory Visit (HOSPITAL_COMMUNITY): Payer: Self-pay | Admitting: Physician Assistant

## 2023-04-19 DIAGNOSIS — R41840 Attention and concentration deficit: Secondary | ICD-10-CM

## 2023-04-19 DIAGNOSIS — F319 Bipolar disorder, unspecified: Secondary | ICD-10-CM

## 2023-04-20 ENCOUNTER — Other Ambulatory Visit: Payer: Self-pay

## 2023-04-24 ENCOUNTER — Other Ambulatory Visit: Payer: Self-pay

## 2023-05-01 ENCOUNTER — Other Ambulatory Visit: Payer: Self-pay

## 2023-05-02 ENCOUNTER — Other Ambulatory Visit: Payer: Self-pay

## 2023-06-20 ENCOUNTER — Other Ambulatory Visit: Payer: Self-pay

## 2023-06-20 ENCOUNTER — Other Ambulatory Visit (HOSPITAL_COMMUNITY): Payer: Self-pay | Admitting: Physician Assistant

## 2023-06-20 DIAGNOSIS — R41840 Attention and concentration deficit: Secondary | ICD-10-CM

## 2023-06-20 DIAGNOSIS — F319 Bipolar disorder, unspecified: Secondary | ICD-10-CM

## 2023-06-21 ENCOUNTER — Telehealth (HOSPITAL_COMMUNITY): Payer: Self-pay | Admitting: Physician Assistant

## 2023-06-21 ENCOUNTER — Other Ambulatory Visit (HOSPITAL_COMMUNITY): Payer: Self-pay | Admitting: Physician Assistant

## 2023-06-21 DIAGNOSIS — F319 Bipolar disorder, unspecified: Secondary | ICD-10-CM

## 2023-06-21 DIAGNOSIS — R41840 Attention and concentration deficit: Secondary | ICD-10-CM

## 2023-06-21 MED ORDER — ATOMOXETINE HCL 40 MG PO CAPS
40.0000 mg | ORAL_CAPSULE | Freq: Every day | ORAL | 0 refills | Status: DC
  Filled 2023-06-21: qty 30, 30d supply, fill #0

## 2023-06-21 MED ORDER — ARIPIPRAZOLE 5 MG PO TABS
5.0000 mg | ORAL_TABLET | Freq: Every day | ORAL | 0 refills | Status: DC
  Filled 2023-06-21: qty 30, 30d supply, fill #0

## 2023-06-21 NOTE — Progress Notes (Signed)
Provider was contacted by Jacky Kindle regarding patient's request for medication refill. Provider was able to reach out to patient to determine which medications need to be refilled. Patient requested that her Abilify and Strattera be refilled. Provider to e-prescribe patient's medications to pharmacy of choice.

## 2023-06-21 NOTE — Telephone Encounter (Signed)
Message acknowledged and reviewed.

## 2023-06-22 ENCOUNTER — Other Ambulatory Visit: Payer: Self-pay

## 2023-07-04 ENCOUNTER — Encounter (HOSPITAL_COMMUNITY): Payer: Self-pay | Admitting: Physician Assistant

## 2023-07-04 ENCOUNTER — Telehealth (HOSPITAL_COMMUNITY): Payer: No Payment, Other | Admitting: Physician Assistant

## 2023-07-04 DIAGNOSIS — R41840 Attention and concentration deficit: Secondary | ICD-10-CM | POA: Diagnosis not present

## 2023-07-04 DIAGNOSIS — F411 Generalized anxiety disorder: Secondary | ICD-10-CM

## 2023-07-04 DIAGNOSIS — F319 Bipolar disorder, unspecified: Secondary | ICD-10-CM

## 2023-07-04 MED ORDER — ATOMOXETINE HCL 40 MG PO CAPS
40.0000 mg | ORAL_CAPSULE | Freq: Every day | ORAL | 1 refills | Status: DC
  Filled 2023-07-04 – 2023-08-16 (×3): qty 30, 30d supply, fill #0

## 2023-07-04 MED ORDER — LAMOTRIGINE 200 MG PO TABS
200.0000 mg | ORAL_TABLET | Freq: Every day | ORAL | 1 refills | Status: DC
  Filled 2023-07-04 – 2023-08-16 (×3): qty 30, 30d supply, fill #0

## 2023-07-04 MED ORDER — ARIPIPRAZOLE 10 MG PO TABS
10.0000 mg | ORAL_TABLET | Freq: Every day | ORAL | 1 refills | Status: DC
  Filled 2023-07-04: qty 30, 30d supply, fill #0

## 2023-07-04 MED ORDER — GABAPENTIN 400 MG PO CAPS
400.0000 mg | ORAL_CAPSULE | Freq: Three times a day (TID) | ORAL | 1 refills | Status: DC
  Filled 2023-07-04 – 2023-08-16 (×3): qty 90, 30d supply, fill #0

## 2023-07-04 MED ORDER — GABAPENTIN 100 MG PO CAPS
100.0000 mg | ORAL_CAPSULE | Freq: Every day | ORAL | 1 refills | Status: DC | PRN
  Filled 2023-07-04: qty 30, 30d supply, fill #0

## 2023-07-04 MED ORDER — HYDROXYZINE HCL 10 MG PO TABS
10.0000 mg | ORAL_TABLET | Freq: Three times a day (TID) | ORAL | 1 refills | Status: DC | PRN
  Filled 2023-07-04: qty 75, 25d supply, fill #0
  Filled 2023-08-16 (×2): qty 75, 25d supply, fill #1
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
## 2023-07-05 ENCOUNTER — Other Ambulatory Visit: Payer: Self-pay

## 2023-07-11 ENCOUNTER — Other Ambulatory Visit: Payer: Self-pay

## 2023-07-20 ENCOUNTER — Other Ambulatory Visit: Payer: Self-pay

## 2023-07-31 ENCOUNTER — Other Ambulatory Visit: Payer: Self-pay

## 2023-08-16 ENCOUNTER — Other Ambulatory Visit: Payer: Self-pay

## 2023-08-16 ENCOUNTER — Telehealth (HOSPITAL_COMMUNITY): Payer: No Payment, Other | Admitting: Physician Assistant

## 2023-08-16 ENCOUNTER — Encounter (HOSPITAL_COMMUNITY): Payer: Self-pay | Admitting: Physician Assistant

## 2023-08-16 DIAGNOSIS — R41840 Attention and concentration deficit: Secondary | ICD-10-CM | POA: Diagnosis not present

## 2023-08-16 DIAGNOSIS — F411 Generalized anxiety disorder: Secondary | ICD-10-CM

## 2023-08-16 DIAGNOSIS — F319 Bipolar disorder, unspecified: Secondary | ICD-10-CM | POA: Diagnosis not present

## 2023-08-16 MED ORDER — ATOMOXETINE HCL 40 MG PO CAPS: 40.0000 mg | ORAL_CAPSULE | Freq: Every day | ORAL | 2 refills | Status: DC

## 2023-08-16 MED ORDER — LAMOTRIGINE 200 MG PO TABS: 200.0000 mg | ORAL_TABLET | Freq: Every day | ORAL | 2 refills | Status: DC

## 2023-08-16 MED ORDER — GABAPENTIN 400 MG PO CAPS: 400.0000 mg | ORAL_CAPSULE | Freq: Three times a day (TID) | ORAL | 2 refills | Status: DC

## 2023-08-16 MED ORDER — ARIPIPRAZOLE 10 MG PO TABS
10.0000 mg | ORAL_TABLET | Freq: Every day | ORAL | 2 refills | Status: DC
  Filled 2023-08-16: qty 30, 30d supply, fill #0

## 2023-08-16 MED ORDER — GABAPENTIN 100 MG PO CAPS
100.0000 mg | ORAL_CAPSULE | Freq: Every day | ORAL | 2 refills | Status: AC | PRN
  Filled 2023-08-16: qty 30, 30d supply, fill #0

## 2023-08-16 MED ORDER — HYDROXYZINE HCL 10 MG PO TABS: 10.0000 mg | ORAL_TABLET | Freq: Three times a day (TID) | ORAL | 1 refills | Status: DC | PRN
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
## 2023-08-17 ENCOUNTER — Other Ambulatory Visit: Payer: Self-pay

## 2023-08-22 ENCOUNTER — Other Ambulatory Visit: Payer: Self-pay

## 2023-08-28 ENCOUNTER — Encounter (HOSPITAL_COMMUNITY): Payer: Self-pay

## 2023-08-30 ENCOUNTER — Ambulatory Visit (INDEPENDENT_AMBULATORY_CARE_PROVIDER_SITE_OTHER): Payer: No Payment, Other | Admitting: Clinical

## 2023-08-30 DIAGNOSIS — F319 Bipolar disorder, unspecified: Secondary | ICD-10-CM
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
## 2023-09-26 ENCOUNTER — Encounter (HOSPITAL_COMMUNITY): Payer: Self-pay

## 2023-09-26 ENCOUNTER — Ambulatory Visit (HOSPITAL_COMMUNITY): Admitting: Clinical

## 2023-10-11 ENCOUNTER — Ambulatory Visit (HOSPITAL_COMMUNITY): Admitting: Clinical

## 2023-10-11 DIAGNOSIS — F319 Bipolar disorder, unspecified: Secondary | ICD-10-CM | POA: Diagnosis not present

## 2023-10-11 NOTE — Progress Notes (Signed)
 THERAPIST PROGRESS NOTE Virtual Visit via Video Note  I connected with Georgeana Kindler on 10/11/23 at  9:00 AM EDT by a video enabled telemedicine application and verified that I am speaking with the correct person using two identifiers.  Location: Patient: home Provider: office   I discussed the limitations of evaluation and management by telemedicine and the availability of in person appointments. The patient expressed understanding and agreed to proceed.   Follow Up Instructions: I discussed the assessment and treatment plan with the patient. The patient was provided an opportunity to ask questions and all were answered. The patient agreed with the plan and demonstrated an understanding of the instructions.   The patient was advised to call back or seek an in-person evaluation if the symptoms worsen or if the condition fails to improve as anticipated.   Session Time: 40 minutes  Participation Level: Active  Behavioral Response: CasualAlertEuthymic  Type of Therapy: Individual Therapy  Treatment Goals addressed: Noma WILL IDENTIFY COGNITIVE PATTERNS AND BELIEFS THAT INTERFERE WITH THERAPY   ProgressTowards Goals: Progressing  Interventions: CBT and Supportive  Summary:  Taliesha Acosta is a 33 y.o. female who presents for the scheduled appointment oriented times five, appropriately dressed and friendly. Client denied hallucinations and delusions. Client reported on today she is doing fairly okay. Client reported she has her hands full with work, college, and home schooling her 61 year old daughters. Client reported she has been making it work pretty well but still working on some challenges. Client reported she is working on speaking up for herself before she gets to a point of complete agitation and comes across as yelling and offensive towards her family.  Client reported she is asking for help from her husband and giving her kids more instruction on things they need to do  around the house to be more helpful for her.  Client reported her job is going very well and she ended her school semester with a 3.6 GPA.  Client reported Mother's Day weekend she did forget to take her medication and she could tell a difference because noise amongst other things seem to be intensified for overstimulating her.  Client reported her children are very clingy towards her and it can be overwhelming sometimes because she did not grow up that way. Client reported overall she does love her family but will keep working on communicating so things run smoothly and everything does not always fall back on her to do. Evidence of progress towards goal:  client reported 1 positive of communicating more effectively.   Suicidal/Homicidal: Nowithout intent/plan  Therapist Response:  Therapist began the appointment asking the client how she has been doing since last seen. Therapist engaged with active listening and positive emotional support. Therapist used CBT to ask the client how she is managing with schooling, work and other responsibilities. Therapist used CBT to ask the client to identify ways that she tries to appropriately manage her stressors. Therapist used CBT to positively reinforce the clients appropriate communication skills and her consistency of doing so. Therapist used CBT ask the client to identify her progress with frequency of use with coping skills with continued practice in her daily activity.    Therapist assigned client homework to practice self-care.   Plan: Return again in 4 weeks.  Diagnosis: bipolar 1 disorder  Collaboration of Care: Patient refused AEB none requested by the client.  Patient/Guardian was advised Release of Information must be obtained prior to any record release in order to collaborate  their care with an outside provider. Patient/Guardian was advised if they have not already done so to contact the registration department to sign all necessary forms in  order for us  to release information regarding their care.   Consent: Patient/Guardian gives verbal consent for treatment and assignment of benefits for services provided during this visit. Patient/Guardian expressed understanding and agreed to proceed.   Dystany Duffy Y Kenyah Luba, LCSW 10/11/2023

## 2023-10-23 ENCOUNTER — Encounter (HOSPITAL_COMMUNITY): Payer: Self-pay | Admitting: Physician Assistant

## 2023-10-23 ENCOUNTER — Telehealth (HOSPITAL_COMMUNITY): Admitting: Physician Assistant

## 2023-10-23 DIAGNOSIS — Z79899 Other long term (current) drug therapy: Secondary | ICD-10-CM | POA: Diagnosis not present

## 2023-10-23 DIAGNOSIS — F319 Bipolar disorder, unspecified: Secondary | ICD-10-CM

## 2023-10-23 DIAGNOSIS — F411 Generalized anxiety disorder: Secondary | ICD-10-CM

## 2023-10-23 DIAGNOSIS — R41840 Attention and concentration deficit: Secondary | ICD-10-CM

## 2023-10-23 MED ORDER — LAMOTRIGINE 200 MG PO TABS
200.0000 mg | ORAL_TABLET | Freq: Every day | ORAL | 2 refills | Status: AC
  Filled 2023-10-23: qty 30, 30d supply, fill #0

## 2023-10-23 MED ORDER — GABAPENTIN 400 MG PO CAPS
400.0000 mg | ORAL_CAPSULE | Freq: Three times a day (TID) | ORAL | 2 refills | Status: AC
Start: 2023-10-23 — End: ?
  Filled 2023-10-23: qty 90, 30d supply, fill #0

## 2023-10-23 MED ORDER — LURASIDONE HCL 20 MG PO TABS
ORAL_TABLET | ORAL | 0 refills | Status: DC
  Filled 2023-10-23: qty 54, 30d supply, fill #0

## 2023-10-23 MED ORDER — HYDROXYZINE HCL 10 MG PO TABS
10.0000 mg | ORAL_TABLET | Freq: Three times a day (TID) | ORAL | 1 refills | Status: AC | PRN
  Filled 2023-10-23: qty 75, 25d supply, fill #0

## 2023-10-23 MED ORDER — ATOMOXETINE HCL 40 MG PO CAPS
40.0000 mg | ORAL_CAPSULE | Freq: Every day | ORAL | 2 refills | Status: DC
  Filled 2023-10-23: qty 30, 30d supply, fill #0

## 2023-10-23 MED ORDER — LURASIDONE HCL 40 MG PO TABS
40.0000 mg | ORAL_TABLET | Freq: Every day | ORAL | 1 refills | Status: DC
  Filled 2023-10-23: qty 30, 30d supply, fill #0
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
## 2023-10-24 ENCOUNTER — Other Ambulatory Visit: Payer: Self-pay

## 2023-10-26 DIAGNOSIS — Z79899 Other long term (current) drug therapy: Secondary | ICD-10-CM | POA: Insufficient documentation

## 2023-11-05 ENCOUNTER — Other Ambulatory Visit: Payer: Self-pay

## 2023-11-07 ENCOUNTER — Ambulatory Visit (INDEPENDENT_AMBULATORY_CARE_PROVIDER_SITE_OTHER): Admitting: Clinical

## 2023-11-07 DIAGNOSIS — F319 Bipolar disorder, unspecified: Secondary | ICD-10-CM | POA: Diagnosis not present
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
## 2023-11-13 ENCOUNTER — Ambulatory Visit (INDEPENDENT_AMBULATORY_CARE_PROVIDER_SITE_OTHER): Admitting: Clinical

## 2023-11-13 DIAGNOSIS — F319 Bipolar disorder, unspecified: Secondary | ICD-10-CM | POA: Diagnosis not present

## 2023-11-13 NOTE — Progress Notes (Signed)
 THERAPIST PROGRESS NOTE Virtual Visit via Video Note  I connected with Vicki Ford on 11/13/23 at  2:00 PM EDT by a video enabled telemedicine application and verified that I am speaking with the correct person using two identifiers.  Location: Patient: home Provider: office   I discussed the limitations of evaluation and management by telemedicine and the availability of in person appointments. The patient expressed understanding and agreed to proceed.   Follow Up Instructions:  I discussed the assessment and treatment plan with the patient. The patient was provided an opportunity to ask questions and all were answered. The patient agreed with the plan and demonstrated an understanding of the instructions.   The patient was advised to call back or seek an in-person evaluation if the symptoms worsen or if the condition fails to improve as anticipated.   Session Time: 60 minutes  Participation Level: Active  Behavioral Response: CasualAlertDepressed  Type of Therapy: Individual Therapy  Treatment Goals addressed: Barbie WILL IDENTIFY AT LEAST 1 COGNITIVE PATTERNS AND BELIEFS THAT INTERFERE WITH THERAPY   ProgressTowards Goals: Progressing  Interventions: CBT and Supportive  Summary:  Vicki Ford is a 33 y.o. female who presents for the scheduled appointment oriented x 5, appropriately dressed, and cooperative. Client denied hallucinations and delusions. Client reported on today the depressive episode has continued. Client reported yesterday she had a mini manic episode. Client reported yesterday having passive suicidal ideations and contemplating stepping out into traffic.  Client reported she did not want to hurt her children so instead she decided to cut her hair. Client's hair was noticeably shorter length. Client reported she cut her hair because she felt like that was the only thing that she could control at the time. Client reported she feels physically and emotionally  drained. Client reported it feels like her body is not autopilot like an out of body experience. Client reported she expresses that she needs help to her husband but he makes comments like  where you upset or what you have to be upset about. Client reported she can communicate with him about needing help but he only changes for about 2 weeks and then things go back to normal. Client reported she has to be okay because everything falls back on her to make sure the kids in the house is functioning as it needs to.  Client reported the financial strain that her husband constantly mentions also weighs on her because she picked up a job to help offset some of the financial instability but it still is not enough. Client reported she feels like she is not seen by her children or her husband.  Client reported she speaks with her best friend who lives in [place]  who is also practicing in the mental health field.  Client reported they talk on a pretty frequent basis.  Client reported she has been medication compliant but she feels like she is resistant to her medication regimen and would like to see her psychiatrist sooner than later.  Client reported she has no family support, no support from her husband, and no support from her husband's family whom they live closer to.  Client reported in regards to discussing safety planning she does not think that her husband would be a reliable person to assist her in getting her to crisis services due to his poor communication and help with things that she ask for with as is.  Client reported she could call her best friend who is in [place]  to  help reinforce that she call the crisis phone number if need be.  Client reported she would need to think of some other activities that she can do to intervene to alleviate the severity of how she is feeling to make better choices.  Client reported she used to journal and she will start back doing that.  Client reported on today she is not  suicidal contemplating and/or having a plan to harm herself.  Client understood that so long as her kids were at home with her husband she should call the 24-hour crisis phone number if she is unable to alleviate the severity of her depressive thoughts and emotions. Evidence of progress towards goal: Client was able to identify 1 positive support person that could talk her through accessing crisis services as a part of her safety plan.  Client identified at least 1 activity of journaling which she could engage in to help reframe automatic negative thoughts that she has.  Suicidal/Homicidal: Nowithout intent/plan  Therapist Response:  Therapist began the appointment asking the client how she has been doing since last seen. Therapist engaged with active listening and positive emotional support. Therapist used CBT to ask the client about her present severity of depressive and/or manic symptoms. Therapist used CBT to ask the client to identify stressors as well as automatic negative thoughts that amplify her feelings of depression and/or distress. 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 Therapist used CBT to discuss safety planning including identifying her early warning signs, coping activities, and people to contact to help her navigate calling the 24-hour mobile crisis phone number or having herself brought to [facility] urgent care. Therapist assigned client homework to go over the information to help her write out her thought records to evaluate and reframe her thoughts.    Plan: Return again in 1 weeks.  Diagnosis: bipolar 1 disorder  Collaboration of Care: Patient refused AEB no other needs at this time.  Patient/Guardian was advised Release of Information must be obtained prior to any record  release in order to collaborate their care with an outside provider. Patient/Guardian was advised if they have not already done so to contact the registration department to sign all necessary forms in order for us  to release information regarding their care.   Consent: Patient/Guardian gives verbal consent for treatment and assignment of benefits for services provided during this visit. Patient/Guardian expressed understanding and agreed to proceed.   Bane Hagy Y Makenleigh Crownover, LCSW 11/13/2023

## 2023-11-20 ENCOUNTER — Encounter (HOSPITAL_COMMUNITY): Payer: Self-pay

## 2023-11-20 ENCOUNTER — Ambulatory Visit (INDEPENDENT_AMBULATORY_CARE_PROVIDER_SITE_OTHER): Admitting: Clinical

## 2023-11-20 DIAGNOSIS — F319 Bipolar disorder, unspecified: Secondary | ICD-10-CM

## 2023-11-20 NOTE — Progress Notes (Signed)
 Therapist sent the client a link for th scheduled virtual visit. Client did not check in using the link. Therapist attempted telephone call to the client number listed. Client telephone continuously rang. No voicemail is set up on the clients line. Therapist was unable to leave a message for the client to respond to.    Collaboration of Care: Patient refused AEB none.  Patient/Guardian was advised Release of Information must be obtained prior to any record release in order to collaborate their care with an outside provider. Patient/Guardian was advised if they have not already done so to contact the registration department to sign all necessary forms in order for us  to release information regarding their care.   Consent: Patient/Guardian gives verbal consent for treatment and assignment of benefits for services provided during this visit. Patient/Guardian expressed understanding and agreed to proceed.    Vicki Etcheverry Y Orhan Mayorga, LCSW

## 2023-12-04 ENCOUNTER — Other Ambulatory Visit: Payer: Self-pay

## 2023-12-04 ENCOUNTER — Telehealth (HOSPITAL_COMMUNITY): Admitting: Physician Assistant

## 2023-12-04 ENCOUNTER — Encounter (HOSPITAL_COMMUNITY): Payer: Self-pay | Admitting: Physician Assistant

## 2023-12-04 DIAGNOSIS — F411 Generalized anxiety disorder: Secondary | ICD-10-CM

## 2023-12-04 DIAGNOSIS — Z79899 Other long term (current) drug therapy: Secondary | ICD-10-CM

## 2023-12-04 DIAGNOSIS — R41840 Attention and concentration deficit: Secondary | ICD-10-CM

## 2023-12-04 DIAGNOSIS — F319 Bipolar disorder, unspecified: Secondary | ICD-10-CM

## 2023-12-04 MED ORDER — LURASIDONE HCL 20 MG PO TABS
20.0000 mg | ORAL_TABLET | Freq: Every day | ORAL | 1 refills | Status: DC
  Filled 2023-12-04: qty 30, 30d supply, fill #0

## 2023-12-04 MED ORDER — ATOMOXETINE HCL 40 MG PO CAPS
40.0000 mg | ORAL_CAPSULE | Freq: Every day | ORAL | 1 refills | Status: DC
  Filled 2023-12-04: qty 30, 30d supply, fill #0
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
## 2023-12-06 ENCOUNTER — Encounter (HOSPITAL_COMMUNITY): Payer: Self-pay

## 2023-12-06 ENCOUNTER — Ambulatory Visit (INDEPENDENT_AMBULATORY_CARE_PROVIDER_SITE_OTHER): Admitting: Clinical

## 2023-12-06 DIAGNOSIS — F319 Bipolar disorder, unspecified: Secondary | ICD-10-CM

## 2023-12-06 NOTE — Progress Notes (Signed)
 Vicki Ford is a 33 y.o. female patient was scheduled for a video virtual therapy visit on today's date. Client did not check-in using the virtual link sent to her telephone number on file. Therapist was unable to contact client by telephone call.     Collaboration of Care: Patient refused AEB none.  Patient/Guardian was advised Release of Information must be obtained prior to any record release in order to collaborate their care with an outside provider. Patient/Guardian was advised if they have not already done so to contact the registration department to sign all necessary forms in order for us  to release information regarding their care.   Consent: Patient/Guardian gives verbal consent for treatment and assignment of benefits for services provided during this visit. Patient/Guardian expressed understanding and agreed to proceed.    Hiromi Knodel Y [name], LCSW

## 2023-12-13 ENCOUNTER — Other Ambulatory Visit: Payer: Self-pay

## 2024-01-15 ENCOUNTER — Telehealth (INDEPENDENT_AMBULATORY_CARE_PROVIDER_SITE_OTHER): Admitting: Physician Assistant

## 2024-01-15 DIAGNOSIS — F411 Generalized anxiety disorder: Secondary | ICD-10-CM

## 2024-01-15 DIAGNOSIS — Z79899 Other long term (current) drug therapy: Secondary | ICD-10-CM | POA: Diagnosis not present

## 2024-01-15 DIAGNOSIS — R41840 Attention and concentration deficit: Secondary | ICD-10-CM

## 2024-01-15 DIAGNOSIS — F319 Bipolar disorder, unspecified: Secondary | ICD-10-CM

## 2024-01-16 ENCOUNTER — Encounter (HOSPITAL_COMMUNITY): Payer: Self-pay | Admitting: Physician Assistant

## 2024-01-16 ENCOUNTER — Other Ambulatory Visit: Payer: Self-pay

## 2024-01-16 MED ORDER — ATOMOXETINE HCL 40 MG PO CAPS
40.0000 mg | ORAL_CAPSULE | Freq: Every day | ORAL | 1 refills | Status: AC
  Filled 2024-01-16: qty 30, 30d supply, fill #0

## 2024-01-16 MED ORDER — LURASIDONE HCL 20 MG PO TABS
20.0000 mg | ORAL_TABLET | Freq: Every day | ORAL | 1 refills | Status: AC
  Filled 2024-01-16: qty 30, 30d supply, fill #0
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
## 2024-01-23 ENCOUNTER — Other Ambulatory Visit: Payer: Self-pay

## 2024-01-25 ENCOUNTER — Other Ambulatory Visit: Payer: Self-pay

## 2024-02-27 ENCOUNTER — Telehealth (HOSPITAL_COMMUNITY): Admitting: Physician Assistant

## 2024-02-27 ENCOUNTER — Encounter (HOSPITAL_COMMUNITY): Payer: Self-pay
